# Patient Record
Sex: Female | Born: 1985 | Race: White | Hispanic: No | Marital: Married | Smoking: Former smoker
Health system: Southern US, Community
[De-identification: ages and names within clinical notes are randomized; demographics above are authoritative.]

## PROBLEM LIST (undated history)

## (undated) DIAGNOSIS — G43909 Migraine, unspecified, not intractable, without status migrainosus: Secondary | ICD-10-CM

## (undated) DIAGNOSIS — G43019 Migraine without aura, intractable, without status migrainosus: Secondary | ICD-10-CM

## (undated) DIAGNOSIS — Z5189 Encounter for other specified aftercare: Secondary | ICD-10-CM

## (undated) HISTORY — PX: ANTERIOR CRUCIATE LIGAMENT REPAIR: SHX115

## (undated) HISTORY — DX: Migraine without aura, intractable, without status migrainosus: G43.019

## (undated) HISTORY — DX: Migraine, unspecified, not intractable, without status migrainosus: G43.909

---
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
## 2016-02-15 ENCOUNTER — Ambulatory Visit (INDEPENDENT_AMBULATORY_CARE_PROVIDER_SITE_OTHER): Payer: BC Managed Care – PPO | Admitting: Neurology

## 2016-02-15 ENCOUNTER — Encounter: Payer: Self-pay | Admitting: Neurology

## 2016-02-15 DIAGNOSIS — G43019 Migraine without aura, intractable, without status migrainosus: Secondary | ICD-10-CM | POA: Diagnosis not present

## 2016-02-15 HISTORY — DX: Migraine without aura, intractable, without status migrainosus: G43.019

## 2016-02-15 MED ORDER — TOPIRAMATE 25 MG PO TABS: ORAL_TABLET | ORAL | 3 refills | Status: DC

## 2016-02-15 MED ORDER — RIZATRIPTAN BENZOATE 10 MG PO TBDP: 10.0000 mg | ORAL_TABLET | Freq: Three times a day (TID) | ORAL | 3 refills | Status: DC | PRN

## 2016-02-15 NOTE — Patient Instructions (Signed)
   Topamax (topiramate) is a seizure medication that has an FDA approval for seizures and for migraine headache. Potential side effects of this medication include weight loss, cognitive slowing, tingling in the fingers and toes, and carbonated drinks will taste bad. If any significant side effects are noted on this drug, please contact our office.  

## 2016-02-15 NOTE — Progress Notes (Signed)
Reason for visit:  Migraine headache  Referring physician:  Dr. Fredrich Birks is a 30 y.o. female  History of present illness:   Susan Grant is a 30 year old right-handed white female with a history of migraine headaches since her late teenage years. The patient had significant problems with headaches in her senior high school year, but when she got to college the headaches improved significantly. The patient indicates that there is no real family history of migraine. The headaches were relatively infrequent until about 8 months ago. At this point, she is having about 15 headache days a month. The patient has been given Fioricet to take for the headache. She is not on any daily medications. The headaches usually start in the occipital area and may project frontally associated with some blurring of vision, but with no significant dizziness or nausea or vomiting. The patient may have some photophobia, occasionally she may have a muffled sensation with the hearing. The patient may have some cognitive impairment with the headache. She indicates that the headaches may last for several hours up to 2 days at a time. She reports no numbness or weakness of the face, arms, or legs. She denies any neck pain or neck stiffness. She is sent to this office for an evaluation.  Past Medical History:  Diagnosis Date  . Common migraine with intractable migraine 02/15/2016  . Migraine     Past Surgical History:  Procedure Laterality Date  . ANTERIOR CRUCIATE LIGAMENT REPAIR Bilateral 2001, 2002  . MENISCUS REPAIR Right 2004    Family History  Problem Relation Age of Onset  . Lung cancer Maternal Grandmother   . Lung cancer Maternal Grandfather   . Breast cancer Paternal Grandmother   . Alzheimer's disease Paternal Grandfather     Social history:  reports that she quit smoking about 3 years ago. She has never used smokeless tobacco. She reports that she drinks alcohol. She reports that she  does not use drugs.  Medications:  Prior to Admission medications   Medication Sig Start Date End Date Taking? Authorizing Provider  butalbital-acetaminophen-caffeine (FIORICET, ESGIC) 50-325-40 MG tablet Take 1 tablet by mouth every 4 (four) hours as needed for headache.    Yes Historical Provider, MD     No Known Allergies  ROS:  Out of a complete 14 system review of symptoms, the patient complains only of the following symptoms, and all other reviewed systems are negative.   Ringing in the ears, dizziness  Blurring of vision  Blood pressure 118/79, pulse 79, height 5\' 5"  (1.651 m), weight 144 lb 8 oz (65.5 kg).  Physical Exam  General: The patient is alert and cooperative at the time of the examination.  Eyes: Pupils are equal, round, and reactive to light. Discs are flat bilaterally.  Neck: The neck is supple, no carotid bruits are noted.  Respiratory: The respiratory examination is clear.  Cardiovascular: The cardiovascular examination reveals a regular rate and rhythm, no obvious murmurs or rubs are noted.  Skin: Extremities are without significant edema.  Neurologic Exam  Mental status: The patient is alert and oriented x 3 at the time of the examination. The patient has apparent normal recent and remote memory, with an apparently normal attention span and concentration ability.  Cranial nerves: Facial symmetry is present. There is good sensation of the face to pinprick and soft touch bilaterally. The strength of the facial muscles and the muscles to head turning and shoulder shrug are normal bilaterally. Speech  is well enunciated, no aphasia or dysarthria is noted. Extraocular movements are full. Visual fields are full. The tongue is midline, and the patient has symmetric elevation of the soft palate. No obvious hearing deficits are noted.  Motor: The motor testing reveals 5 over 5 strength of all 4 extremities. Good symmetric motor tone is noted throughout.  Sensory:  Sensory testing is intact to pinprick, soft touch, vibration sensation, and position sense on all 4 extremities. No evidence of extinction is noted.  Coordination: Cerebellar testing reveals good finger-nose-finger and heel-to-shin bilaterally.  Gait and station: Gait is normal. Tandem gait is normal. Romberg is negative. No drift is seen.  Reflexes: Deep tendon reflexes are symmetric and normal bilaterally. Toes are downgoing bilaterally.   Assessment/Plan:   1. Migraine headache   The patient is having frequent headache at this time. We will add Topamax for her regimen, I have cautioned her about using Fioricet frequently as this may result in rebound headaches. The patient drinks the equivalent of 3 or 4 cups of coffee a day. This may need to be reduced. The patient was given Maxalt to take if needed. She can take Advil or ibuprofen for the headache as well. She will follow-up in 3 months , sooner if needed. She will call for any dose adjustments.  Jill Alexanders MD 02/15/2016 3:30 PM  [facility] [address] [place], [place] [address]  Phone [phone] Fax [phone]

## 2016-05-12 ENCOUNTER — Ambulatory Visit: Payer: BC Managed Care – PPO | Admitting: Adult Health

## 2016-05-17 ENCOUNTER — Encounter: Payer: Self-pay | Admitting: Adult Health

## 2016-05-17 ENCOUNTER — Ambulatory Visit (INDEPENDENT_AMBULATORY_CARE_PROVIDER_SITE_OTHER): Payer: BC Managed Care – PPO | Admitting: Adult Health

## 2016-05-17 VITALS — BP 113/68 | HR 84 | Ht 65.0 in | Wt 140.8 lb

## 2016-05-17 DIAGNOSIS — G43009 Migraine without aura, not intractable, without status migrainosus: Secondary | ICD-10-CM

## 2016-05-17 MED ORDER — TOPIRAMATE 25 MG PO TABS: 75.0000 mg | ORAL_TABLET | Freq: Every day | ORAL | 4 refills | Status: DC

## 2016-05-17 NOTE — Progress Notes (Signed)
I have read the note, and I agree with the clinical assessment and plan.  Susan Grant   

## 2016-05-17 NOTE — Progress Notes (Signed)
PATIENT: Susan Grant DOB: 1985-09-11  REASON FOR VISIT: follow up- migraine HISTORY FROM: patient  HISTORY OF PRESENT ILLNESS: Susan Grant is a 31 year old female with a history of migraine headaches. She returns today for follow-up. She states that since starting Topamax in December she has only had 4 migraines. She states that she had the worst migraine approximately 2 weeks ago. She states her headaches always occur in the back the head. She does have photophobia and phonophobia and nausea with severe headaches. She states that stress is normally a trigger for her. With her last headache she did try Maxalt but unfortunately was not able to take it as soon as the headache began. She states that it did not offer her any benefit. She returns today for an evaluation.  HISTORY 02/15/16:  Susan Grant is a 31 year old right-handed white female with a history of migraine headaches since her late teenage years. The patient had significant problems with headaches in her senior high school year, but when she got to college the headaches improved significantly. The patient indicates that there is no real family history of migraine. The headaches were relatively infrequent until about 8 months ago. At this point, she is having about 15 headache days a month. The patient has been given Fioricet to take for the headache. She is not on any daily medications. The headaches usually start in the occipital area and may project frontally associated with some blurring of vision, but with no significant dizziness or nausea or vomiting. The patient may have some photophobia, occasionally she may have a muffled sensation with the hearing. The patient may have some cognitive impairment with the headache. She indicates that the headaches may last for several hours up to 2 days at a time. She reports no numbness or weakness of the face, arms, or legs. She denies any neck pain or neck stiffness. She is sent to this  office for an evaluation.   REVIEW OF SYSTEMS: Out of a complete 14 system review of symptoms, the patient complains only of the following symptoms, and all other reviewed systems are negative.  Numbness  ALLERGIES: No Known Allergies  HOME MEDICATIONS: Outpatient Medications Prior to Visit  Medication Sig Dispense Refill  . butalbital-acetaminophen-caffeine (FIORICET, ESGIC) 50-325-40 MG tablet Take 1 tablet by mouth every 4 (four) hours as needed for headache.     . rizatriptan (MAXALT-MLT) 10 MG disintegrating tablet Take 1 tablet (10 mg total) by mouth 3 (three) times daily as needed for migraine. 9 tablet 3  . topiramate (TOPAMAX) 25 MG tablet Take one tablet at night for one week, then take 2 tablets at night for one week, then take 3 tablets at night. 90 tablet 3   No facility-administered medications prior to visit.     PAST MEDICAL HISTORY: Past Medical History:  Diagnosis Date  . Common migraine with intractable migraine 02/15/2016  . Migraine     PAST SURGICAL HISTORY: Past Surgical History:  Procedure Laterality Date  . ANTERIOR CRUCIATE LIGAMENT REPAIR Bilateral 2001, 2002  . MENISCUS REPAIR Right 2004    FAMILY HISTORY: Family History  Problem Relation Age of Onset  . Lung cancer Maternal Grandmother   . Lung cancer Maternal Grandfather   . Breast cancer Paternal Grandmother   . Alzheimer's disease Paternal Grandfather     SOCIAL HISTORY: Social History   Social History  . Marital status: Single    Spouse name: N/A  . Number of children: 0  . Years of  education: College   Occupational History  . [employer]    Social History Main Topics  . Smoking status: Former Smoker    Quit date: 2014  . Smokeless tobacco: Never Used  . Alcohol use Yes     Comment: 2-3 drinks per week  . Drug use: No  . Sexual activity: Yes   Other Topics Concern  . Not on file   Social History Narrative   Lives at home w/ her partner   Right-handed   Caffeine: 3  cups of coffee per day      PHYSICAL EXAM  Vitals:   05/17/16 1030  BP: 113/68  Pulse: 84  Weight: 140 lb 12.8 oz (63.9 kg)  Height: 5\' 5"  (1.651 m)   Body mass index is 23.43 kg/m.  Generalized: Well developed, in no acute distress   Neurological examination  Mentation: Alert oriented to time, place, history taking. Follows all commands speech and language fluent Cranial nerve II-XII: Pupils were equal round reactive to light. Extraocular movements were full, visual field were full on confrontational test. Facial sensation and strength were normal. Uvula tongue midline. Head turning and shoulder shrug  were normal and symmetric. Motor: The motor testing reveals 5 over 5 strength of all 4 extremities. Good symmetric motor tone is noted throughout.  Sensory: Sensory testing is intact to soft touch on all 4 extremities. No evidence of extinction is noted.  Coordination: Cerebellar testing reveals good finger-nose-finger and heel-to-shin bilaterally.  Gait and station: Gait is normal. Tandem gait is normal. Romberg is negative. No drift is seen.  Reflexes: Deep tendon reflexes are symmetric and normal bilaterally.   DIAGNOSTIC DATA (LABS, IMAGING, TESTING) - I reviewed patient records, labs, notes, testing and imaging myself where available.     ASSESSMENT AND PLAN 31 y.o. year old female  has a past medical history of Common migraine with intractable migraine (02/15/2016) and Migraine. here with:  1. Migraine headache  Overall the patient is doing well. She will continue on Topamax 75 mg at bedtime. If the numbness and tingling in the hands and feet become intolerable she should let us know. She is advised that she should take Maxalt at the onset of her migraine and repeat in 2 hours if needed. She verbalized understanding. Also discussed birth control while on Topamax. Patient is currently not taking oral contraceptives advised that she should use condoms if she is sexually  active. Patient verbalized understanding. She will follow-up in 6 months or sooner if needed.  I spent 15 minutes with the patient 50% of this time was spent counseling the patient on her medication and birth control.     Ward Givens, MSN, NP-C 05/17/2016, 10:26 AM [facility] [address] [place], [place] [address] [phone]

## 2016-05-17 NOTE — Patient Instructions (Signed)
Continue Topamax If your symptoms worsen or you develop new symptoms please let us know.   

## 2016-11-17 ENCOUNTER — Ambulatory Visit: Payer: BC Managed Care – PPO | Admitting: Adult Health

## 2017-05-08 ENCOUNTER — Other Ambulatory Visit: Payer: Self-pay

## 2017-05-08 ENCOUNTER — Ambulatory Visit: Payer: BC Managed Care – PPO | Admitting: Neurology

## 2017-05-08 ENCOUNTER — Encounter: Payer: Self-pay | Admitting: Neurology

## 2017-05-08 ENCOUNTER — Encounter (INDEPENDENT_AMBULATORY_CARE_PROVIDER_SITE_OTHER): Payer: Self-pay

## 2017-05-08 VITALS — BP 108/71 | HR 81 | Ht 65.0 in | Wt 151.5 lb

## 2017-05-08 DIAGNOSIS — G43019 Migraine without aura, intractable, without status migrainosus: Secondary | ICD-10-CM | POA: Diagnosis not present

## 2017-05-08 MED ORDER — FOLIC ACID 1 MG PO TABS: 1.0000 mg | ORAL_TABLET | Freq: Every day | ORAL | 3 refills | Status: DC

## 2017-05-08 MED ORDER — RIZATRIPTAN BENZOATE 10 MG PO TBDP: 10.0000 mg | ORAL_TABLET | Freq: Three times a day (TID) | ORAL | 3 refills | Status: DC | PRN

## 2017-05-08 NOTE — Progress Notes (Signed)
Reason for visit: Migraine headache  Susan Grant is an 32 y.o. female  History of present illness:  Susan Grant is a 32 year old right-handed white female with a history of migraine headache.  The patient has been on Topamax taking 75 mg in the evening.  The patient is tolerating the medication fairly well.  She has noted a good reduction in her headache frequency, she is having about 1 headache every other month, she takes Maxalt and Advil for the headache.  This seems to help, she is able to get rid of the headache usually within about an hour and a half.  The patient does not miss work because of the headache.  She returns for an evaluation.   Past Medical History:  Diagnosis Date  . Common migraine with intractable migraine 02/15/2016  . Migraine     Past Surgical History:  Procedure Laterality Date  . ANTERIOR CRUCIATE LIGAMENT REPAIR Bilateral 2001, 2002  . MENISCUS REPAIR Right 2004    Family History  Problem Relation Age of Onset  . Lung cancer Maternal Grandmother   . Lung cancer Maternal Grandfather   . Breast cancer Paternal Grandmother   . Alzheimer's disease Paternal Grandfather     Social history:  reports that she quit smoking about 5 years ago. she has never used smokeless tobacco. She reports that she drinks alcohol. She reports that she does not use drugs.   No Known Allergies  Medications:  Prior to Admission medications   Medication Sig Start Date End Date Taking? Authorizing Provider  rizatriptan (MAXALT-MLT) 10 MG disintegrating tablet Take 1 tablet (10 mg total) by mouth 3 (three) times daily as needed for migraine. 05/08/17  Yes Kathrynn Ducking, MD  topiramate (TOPAMAX) 25 MG tablet Take 3 tablets (75 mg total) by mouth at bedtime. 05/17/16  Yes Ward Givens, NP  folic acid (FOLVITE) 1 MG tablet Take 1 tablet (1 mg total) by mouth daily. 05/08/17   Kathrynn Ducking, MD    ROS:  Out of a complete 14 system review of symptoms, the patient  complains only of the following symptoms, and all other reviewed systems are negative.  Headache  Blood pressure 108/71, pulse 81, height 5\' 5"  (1.651 m), weight 151 lb 8 oz (68.7 kg).  Physical Exam  General: The patient is alert and cooperative at the time of the examination.  Skin: No significant peripheral edema is noted.   Neurologic Exam  Mental status: The patient is alert and oriented x 3 at the time of the examination. The patient has apparent normal recent and remote memory, with an apparently normal attention span and concentration ability.   Cranial nerves: Facial symmetry is present. Speech is normal, no aphasia or dysarthria is noted. Extraocular movements are full. Visual fields are full.  Motor: The patient has good strength in all 4 extremities.  Sensory examination: Soft touch sensation is symmetric on the face, arms, and legs.  Coordination: The patient has good finger-nose-finger and heel-to-shin bilaterally.  Gait and station: The patient has a normal gait. Tandem gait is normal. Romberg is negative. No drift is seen.  Reflexes: Deep tendon reflexes are symmetric.   Assessment/Plan:  1.  Migraine headache  The patient is now under fairly good control, she will continue the Maxalt if needed, a prescription was sent in.  The patient will continue the Topamax at the current dose.  Folic acid 1 mg tablet will be added to the regimen.  The patient will follow-up  in 1 year.  Jill Alexanders MD 05/08/2017 8:19 AM  [facility] [address] [place], [place] [address]  Phone [phone] Fax [phone]

## 2017-06-19 ENCOUNTER — Other Ambulatory Visit: Payer: Self-pay | Admitting: Adult Health

## 2018-05-06 ENCOUNTER — Other Ambulatory Visit: Payer: Self-pay | Admitting: Neurology

## 2018-05-10 ENCOUNTER — Ambulatory Visit: Payer: BC Managed Care – PPO | Admitting: Adult Health

## 2018-06-24 ENCOUNTER — Other Ambulatory Visit: Payer: Self-pay | Admitting: Neurology

## 2018-06-28 ENCOUNTER — Telehealth: Payer: Self-pay | Admitting: Neurology

## 2018-06-28 NOTE — Telephone Encounter (Signed)
Called and spoke to Patient about virtual visit she wanted to CX her apt. For now and she will call us back at a later date.

## 2018-07-04 ENCOUNTER — Ambulatory Visit: Payer: BC Managed Care – PPO | Admitting: Neurology

## 2018-07-04 ENCOUNTER — Ambulatory Visit: Payer: BC Managed Care – PPO | Admitting: Adult Health

## 2018-09-23 ENCOUNTER — Other Ambulatory Visit: Payer: Self-pay | Admitting: Neurology

## 2019-08-22 LAB — OB RESULTS CONSOLE RUBELLA ANTIBODY, IGM: Rubella: IMMUNE

## 2019-08-22 LAB — OB RESULTS CONSOLE HEPATITIS B SURFACE ANTIGEN: Hepatitis B Surface Ag: NEGATIVE

## 2019-08-22 LAB — OB RESULTS CONSOLE HIV ANTIBODY (ROUTINE TESTING): HIV: NONREACTIVE

## 2019-08-26 LAB — OB RESULTS CONSOLE GC/CHLAMYDIA
Chlamydia: NEGATIVE
Gonorrhea: NEGATIVE

## 2019-09-19 ENCOUNTER — Other Ambulatory Visit: Payer: Self-pay

## 2019-09-19 ENCOUNTER — Encounter (HOSPITAL_COMMUNITY): Payer: Self-pay | Admitting: Obstetrics and Gynecology

## 2019-09-19 ENCOUNTER — Inpatient Hospital Stay (HOSPITAL_COMMUNITY): Payer: BC Managed Care – PPO

## 2019-09-19 ENCOUNTER — Inpatient Hospital Stay (HOSPITAL_COMMUNITY)
Admission: AD | Admit: 2019-09-19 | Discharge: 2019-09-19 | Disposition: A | Discharge status: Home / Self Care | Payer: BC Managed Care – PPO | Attending: Obstetrics and Gynecology | Admitting: Obstetrics and Gynecology

## 2019-09-19 DIAGNOSIS — D259 Leiomyoma of uterus, unspecified: Secondary | ICD-10-CM | POA: Diagnosis not present

## 2019-09-19 DIAGNOSIS — Z87891 Personal history of nicotine dependence: Secondary | ICD-10-CM | POA: Diagnosis not present

## 2019-09-19 DIAGNOSIS — Z79899 Other long term (current) drug therapy: Secondary | ICD-10-CM | POA: Insufficient documentation

## 2019-09-19 DIAGNOSIS — O208 Other hemorrhage in early pregnancy: Secondary | ICD-10-CM | POA: Diagnosis not present

## 2019-09-19 DIAGNOSIS — O3412 Maternal care for benign tumor of corpus uteri, second trimester: Secondary | ICD-10-CM | POA: Diagnosis not present

## 2019-09-19 DIAGNOSIS — O209 Hemorrhage in early pregnancy, unspecified: Secondary | ICD-10-CM | POA: Diagnosis present

## 2019-09-19 DIAGNOSIS — O418X10 Other specified disorders of amniotic fluid and membranes, first trimester, not applicable or unspecified: Secondary | ICD-10-CM

## 2019-09-19 DIAGNOSIS — Z3A14 14 weeks gestation of pregnancy: Secondary | ICD-10-CM | POA: Insufficient documentation

## 2019-09-19 LAB — URINALYSIS, ROUTINE W REFLEX MICROSCOPIC
Bilirubin Urine: NEGATIVE
Glucose, UA: NEGATIVE mg/dL
Ketones, ur: 5 mg/dL — AB
Leukocytes,Ua: NEGATIVE
Nitrite: NEGATIVE
Protein, ur: NEGATIVE mg/dL
Specific Gravity, Urine: 1.006 (ref 1.005–1.030)
pH: 5 (ref 5.0–8.0)

## 2019-09-19 NOTE — MAU Provider Note (Signed)
Chief Complaint: Vaginal Bleeding   First Provider Initiated Contact with Patient 09/19/19 1717     SUBJECTIVE HPI: Susan Grant is a 34 y.o. G1P0 at [redacted]w[redacted]d who presents to Maternity Admissions reporting heavy vaginal bleeding and cramping. She denies abnormal vaginal discharge and recent intercourse (pt is in a same sex relationship). She began feeling some cramping two days ago and it has gotten progressively worse, then began bleeding heavily today. She has had two episodes of large clots followed by bright red bleeding. Bleeding slows when she rests, picks up when she goes to the bathroom. She has a confirmed IUP following IVF and was told she has a complete previa per her private MD.   Past Medical History:  Diagnosis Date  . Common migraine with intractable migraine 02/15/2016  . Migraine    OB History  Gravida Para Term Preterm AB Living  1            SAB TAB Ectopic Multiple Live Births               # Outcome Date GA Lbr Len/2nd Weight Sex Delivery Anes PTL Lv  1 Current            Past Surgical History:  Procedure Laterality Date  . ANTERIOR CRUCIATE LIGAMENT REPAIR Bilateral 2001, 2002  . MENISCUS REPAIR Right 2004   Social History   Socioeconomic History  . Marital status: Married    Spouse name: Not on file  . Number of children: 0  . Years of education: College  . Highest education level: Not on file  Occupational History  . Occupation: [employer]  Tobacco Use  . Smoking status: Former Smoker    Quit date: 2014    Years since quitting: 7.5  . Smokeless tobacco: Never Used  Vaping Use  . Vaping Use: Never used  Substance and Sexual Activity  . Alcohol use: Not Currently    Comment: 2-3 drinks per week  . Drug use: No  . Sexual activity: Not Currently  Other Topics Concern  . Not on file  Social History Narrative   Lives at home w/ her partner   Right-handed   Caffeine: 3 cups of coffee per day   Social Determinants of Health    Financial Resource Strain:   . Difficulty of Paying Living Expenses:   Food Insecurity:   . Worried About Charity fundraiser in the Last Year:   . Arboriculturist in the Last Year:   Transportation Needs:   . Film/video editor (Medical):   Marland Kitchen Lack of Transportation (Non-Medical):   Physical Activity:   . Days of Exercise per Week:   . Minutes of Exercise per Session:   Stress:   . Feeling of Stress :   Social Connections:   . Frequency of Communication with Friends and Family:   . Frequency of Social Gatherings with Friends and Family:   . Attends Religious Services:   . Active Member of Clubs or Organizations:   . Attends Archivist Meetings:   Marland Kitchen Marital Status:   Intimate Partner Violence:   . Fear of Current or Ex-Partner:   . Emotionally Abused:   Marland Kitchen Physically Abused:   . Sexually Abused:    Family History  Problem Relation Age of Onset  . Lung cancer Maternal Grandmother   . Lung cancer Maternal Grandfather   . Breast cancer Paternal Grandmother   . Alzheimer's disease Paternal Grandfather  No current facility-administered medications on file prior to encounter.   Current Outpatient Medications on File Prior to Encounter  Medication Sig Dispense Refill  . Prenatal Vit-Fe Fumarate-FA (MULTIVITAMIN-PRENATAL) 27-0.8 MG TABS tablet Take 1 tablet by mouth daily at 12 noon.    . folic acid (FOLVITE) 1 MG tablet TAKE 1 TABLET BY MOUTH EVERY DAY 90 tablet 3  . rizatriptan (MAXALT-MLT) 10 MG disintegrating tablet Take 1 tablet (10 mg total) by mouth 3 (three) times daily as needed for migraine. 9 tablet 3  . topiramate (TOPAMAX) 25 MG tablet TAKE 3 TABLETS (75 MG TOTAL) BY MOUTH AT BEDTIME. 270 tablet 0   No Known Allergies  I have reviewed patient's Past Medical Hx, Surgical Hx, Family Hx, Social Hx, medications and allergies.   Review of Systems  Constitutional: Negative for fatigue and fever.  Eyes: Negative for photophobia and visual disturbance.   Cardiovascular: Negative for palpitations.  Gastrointestinal: Positive for constipation (had bowel movement yesterday, was normal no straining). Negative for diarrhea, nausea and vomiting.  Genitourinary: Positive for frequency, pelvic pain, urgency and vaginal bleeding.  Neurological: Negative for dizziness, syncope, light-headedness and headaches.  All other systems reviewed and are negative.   OBJECTIVE Patient Vitals for the past 24 hrs:  BP Temp Temp src Pulse Resp SpO2 Height Weight  09/19/19 1637 113/78 98.6 F (37 C) -- (!) 103 18 -- 5\' 5"  (1.651 m) 157 lb (71.2 kg)  09/19/19 1611 -- -- -- (!) 104 -- -- -- --  09/19/19 1603 127/77 98.1 F (36.7 C) -- (!) 115 17 99 % -- --  09/19/19 1558 127/77 98.1 F (36.7 C) Oral (!) 115 18 100 % -- --   Constitutional: Well-developed, well-nourished female in no acute distress.  Cardiovascular: normal rate & rhythm, no murmur Respiratory: normal rate and effort. Lung sounds clear throughout GI: Abd soft, non-tender, Pos BS x 4. No guarding or rebound tenderness MS: Extremities nontender, no edema, normal ROM Neurologic: Alert and oriented x 4.  SPECULUM EXAM: NEFG, physiologic discharge, large clot removed, blood noted in vaginal vault, no CMT and cervix closed.      LAB RESULTS Results for orders placed or performed during the hospital encounter of 09/19/19 (from the past 24 hour(s))  Urinalysis, Routine w reflex microscopic     Status: Abnormal   Collection Time: 09/19/19  5:31 PM  Result Value Ref Range   Color, Urine STRAW (A) YELLOW   APPearance CLEAR CLEAR   Specific Gravity, Urine 1.006 1.005 - 1.030   pH 5.0 5.0 - 8.0   Glucose, UA NEGATIVE NEGATIVE mg/dL   Hgb urine dipstick LARGE (A) NEGATIVE   Bilirubin Urine NEGATIVE NEGATIVE   Ketones, ur 5 (A) NEGATIVE mg/dL   Protein, ur NEGATIVE NEGATIVE mg/dL   Nitrite NEGATIVE NEGATIVE   Leukocytes,Ua NEGATIVE NEGATIVE   RBC / HPF 11-20 0 - 5 RBC/hpf   WBC, UA 0-5 0 - 5  WBC/hpf   Bacteria, UA RARE (A) NONE SEEN   Squamous Epithelial / LPF 0-5 0 - 5   Mucus PRESENT     IMAGING US OB Comp Less 14 Wks  Result Date: 09/19/2019 CLINICAL DATA:  Back vaginal bleeding EXAM: OBSTETRIC <14 WK ULTRASOUND TECHNIQUE: Transabdominal ultrasound was performed for evaluation of the gestation as well as the maternal uterus and adnexal regions. COMPARISON:  None. FINDINGS: Intrauterine gestational sac: Single Yolk sac:  Not Visualized. Embryo:  Visualized. Cardiac Activity: Visualized. Heart Rate: 163 bpm CRL:   84.7 mm  14 w 2 d                  [place] EDC: 03/17/2020 Subchorionic hemorrhage: There is a moderate volume of subchorionic hemorrhage. Maternal uterus/adnexae: There is a uterine fibroid measuring approximately 5.7 cm. The ovaries are unremarkable. IMPRESSION: 1. Single live IUP at 14 weeks and 2 days. 2. Moderate volume of subchorionic hemorrhage. 3. There is a 5.7 cm uterine fibroid. Electronically Signed   By: Constance Holster M.D.   On: 09/19/2019 18:43    MAU COURSE Orders Placed This Encounter  Procedures  . Culture, OB Urine  . US OB Comp Less 14 Wks  . Urinalysis, Routine w reflex microscopic  . Discharge patient   No orders of the defined types were placed in this encounter.   MDM UA found rare bacteria, sent for culture Ultrasound showed moderate sized subchorionic hematoma  ASSESSMENT 1. Vaginal bleeding affecting early pregnancy   2. Vaginal bleeding in pregnancy, first trimester   3. Subchorionic hematoma in first trimester, single or unspecified fetus     PLAN Discharge home in stable condition. Bleeding precautions and education on [facility] given    Follow-up Information    [place]   Why: as scheduled for ongoing prenatal care Contact information: [place] [address] [phone]              Allergies as of 09/19/2019   No Known Allergies     Medication List     TAKE these medications   folic acid 1 MG tablet Commonly known as: FOLVITE TAKE 1 TABLET BY MOUTH EVERY DAY   multivitamin-prenatal 27-0.8 MG Tabs tablet Take 1 tablet by mouth daily at 12 noon.   rizatriptan 10 MG disintegrating tablet Commonly known as: MAXALT-MLT Take 1 tablet (10 mg total) by mouth 3 (three) times daily as needed for migraine.   topiramate 25 MG tablet Commonly known as: TOPAMAX TAKE 3 TABLETS (75 MG TOTAL) BY MOUTH AT BEDTIME.        Gabriel Carina, [place] 09/19/2019  8:15 PM

## 2019-09-19 NOTE — Discharge Instructions (Signed)
Second Trimester of Pregnancy  The second trimester is from week 14 through week 27 (month 4 through 6). This is often the time in pregnancy that you feel your best. Often times, morning sickness has lessened or quit. You may have more energy, and you may get hungry more often. Your unborn baby is growing rapidly. At the end of the sixth month, he or she is about 9 inches long and weighs about 1 pounds. You will likely feel the baby move between 18 and 20 weeks of pregnancy. Follow these instructions at home: Medicines  Take over-the-counter and prescription medicines only as told by your doctor. Some medicines are safe and some medicines are not safe during pregnancy.  Take a prenatal vitamin that contains at least 600 micrograms (mcg) of folic acid.  If you have trouble pooping (constipation), take medicine that will make your stool soft (stool softener) if your doctor approves. Eating and drinking   Eat regular, healthy meals.  Avoid raw meat and uncooked cheese.  If you get low calcium from the food you eat, talk to your doctor about taking a daily calcium supplement.  Avoid foods that are high in fat and sugars, such as fried and sweet foods.  If you feel sick to your stomach (nauseous) or throw up (vomit): ? Eat 4 or 5 small meals a day instead of 3 large meals. ? Try eating a few soda crackers. ? Drink liquids between meals instead of during meals.  To prevent constipation: ? Eat foods that are high in fiber, like fresh fruits and vegetables, whole grains, and beans. ? Drink enough fluids to keep your pee (urine) clear or pale yellow. Activity  Exercise only as told by your doctor. Stop exercising if you start to have cramps.  Do not exercise if it is too hot, too humid, or if you are in a place of great height (high altitude).  Avoid heavy lifting.  Wear low-heeled shoes. Sit and stand up straight.  You can continue to have sex unless your doctor tells you not  to. Relieving pain and discomfort  Wear a good support bra if your breasts are tender.  Take warm water baths (sitz baths) to soothe pain or discomfort caused by hemorrhoids. Use hemorrhoid cream if your doctor approves.  Rest with your legs raised if you have leg cramps or low back pain.  If you develop puffy, bulging veins (varicose veins) in your legs: ? Wear support hose or compression stockings as told by your doctor. ? Raise (elevate) your feet for 15 minutes, 3-4 times a day. ? Limit salt in your food. Prenatal care  Write down your questions. Take them to your prenatal visits.  Keep all your prenatal visits as told by your doctor. This is important. Safety  Wear your seat belt when driving.  Make a list of emergency phone numbers, including numbers for family, friends, the hospital, and police and fire departments. General instructions  Ask your doctor about the right foods to eat or for help finding a counselor, if you need these services.  Ask your doctor about local prenatal classes. Begin classes before month 6 of your pregnancy.  Do not use hot tubs, steam rooms, or saunas.  Do not douche or use tampons or scented sanitary pads.  Do not cross your legs for long periods of time.  Visit your dentist if you have not done so. Use a soft toothbrush to brush your teeth. Floss gently.  Avoid all smoking, herbs,   and alcohol. Avoid drugs that are not approved by your doctor.  Do not use any products that contain nicotine or tobacco, such as cigarettes and e-cigarettes. If you need help quitting, ask your doctor.  Avoid cat litter boxes and soil used by cats. These carry germs that can cause birth defects in the baby and can cause a loss of your baby (miscarriage) or stillbirth. Contact a doctor if:  You have mild cramps or pressure in your lower belly.  You have pain when you pee (urinate).  You have bad smelling fluid coming from your vagina.  You continue to  feel sick to your stomach (nauseous), throw up (vomit), or have watery poop (diarrhea).  You have a nagging pain in your belly area.  You feel dizzy. Get help right away if:  You have a fever.  You are leaking fluid from your vagina.  You have spotting or bleeding from your vagina.  You have severe belly cramping or pain.  You lose or gain weight rapidly.  You have trouble catching your breath and have chest pain.  You notice sudden or extreme puffiness (swelling) of your face, hands, ankles, feet, or legs.  You have not felt the baby move in over an hour.  You have severe headaches that do not go away when you take medicine.  You have trouble seeing. Summary  The second trimester is from week 14 through week 27 (months 4 through 6). This is often the time in pregnancy that you feel your best.  To take care of yourself and your unborn baby, you will need to eat healthy meals, take medicines only if your doctor tells you to do so, and do activities that are safe for you and your baby.  Call your doctor if you get sick or if you notice anything unusual about your pregnancy. Also, call your doctor if you need help with the right food to eat, or if you want to know what activities are safe for you. This information is not intended to replace advice given to you by your health care provider. Make sure you discuss any questions you have with your health care provider. Document Revised: 06/15/2018 Document Reviewed: 03/29/2016 Elsevier Patient Education  2020 [place] Hematoma  A subchorionic hematoma is a gathering of blood between the outer wall of the embryo (chorion) and the inner wall of the womb (uterus). This condition can cause vaginal bleeding. If they cause little or no vaginal bleeding, early small hematomas usually shrink on their own and do not affect your baby or pregnancy. When bleeding starts later in pregnancy, or if the hematoma is larger or occurs  in older pregnant women, the condition may be more serious. Larger hematomas may get bigger, which increases the chances of miscarriage. This condition also increases the risk of:  Premature separation of the placenta from the uterus.  Premature (preterm) labor.  Stillbirth. What are the causes? The exact cause of this condition is not known. It occurs when blood is trapped between the placenta and the uterine wall because the placenta has separated from the original site of implantation. What increases the risk? You are more likely to develop this condition if:  You were treated with fertility medicines.  You conceived through in vitro fertilization (IVF). What are the signs or symptoms? Symptoms of this condition include:  Vaginal spotting or bleeding.  Contractions of the uterus. These cause abdominal pain. Sometimes you may have no symptoms and the bleeding may  only be seen when ultrasound images are taken (transvaginal ultrasound). How is this diagnosed? This condition is diagnosed based on a physical exam. This includes a pelvic exam. You may also have other tests, including:  Blood tests.  Urine tests.  Ultrasound of the abdomen. How is this treated? Treatment for this condition can vary. Treatment may include:  Watchful waiting. You will be monitored closely for any changes in bleeding. During this stage: ? The hematoma may be reabsorbed by the body. ? The hematoma may separate the fluid-filled space containing the embryo (gestational sac) from the wall of the womb (endometrium).  Medicines.  Activity restriction. This may be needed until the bleeding stops. Follow these instructions at home:  Stay on bed rest if told to do so by your health care provider.  Do not lift anything that is heavier than 10 lbs. (4.5 kg) or as told by your health care provider.  Do not use any products that contain nicotine or tobacco, such as cigarettes and e-cigarettes. If you need  help quitting, ask your health care provider.  Track and write down the number of pads you use each day and how soaked (saturated) they are.  Do not use tampons.  Keep all follow-up visits as told by your health care provider. This is important. Your health care provider may ask you to have follow-up blood tests or ultrasound tests or both. Contact a health care provider if:  You have any vaginal bleeding.  You have a fever. Get help right away if:  You have severe cramps in your stomach, back, abdomen, or pelvis.  You pass large clots or tissue. Save any tissue for your health care provider to look at.  You have more vaginal bleeding, and you faint or become lightheaded or weak. Summary  A subchorionic hematoma is a gathering of blood between the outer wall of the placenta and the uterus.  This condition can cause vaginal bleeding.  Sometimes you may have no symptoms and the bleeding may only be seen when ultrasound images are taken.  Treatment may include watchful waiting, medicines, or activity restriction. This information is not intended to replace advice given to you by your health care provider. Make sure you discuss any questions you have with your health care provider. Document Revised: 02/03/2017 Document Reviewed: 04/19/2016 Elsevier Patient Education  2020 [employer].

## 2019-09-19 NOTE — ED Provider Notes (Signed)
MSE was initiated and I personally evaluated the patient and placed orders (if any) at  4:13 PM on September 19, 2019.  Briefly patient is a 34 year old female G1, P0 A0 who presents to the emergency department with complaints of pelvic cramping over the past 2 days with vaginal bleeding that started just prior to arrival.  She is approximately [redacted] weeks pregnant, established with an OB, had an ultrasound which confirmed an IUP.  On exam she is nontoxic, resting comfortably, her vitals are within normal limits with the exception of mild tachycardia, improved some to 104 on my exam.  Her abdomen is soft without peritoneal signs.  16:13: Discussed with Elmyra Ricks APP at [facility]- accepts patient in transfer.     Leafy Kindle 09/19/19 1624    Breck Coons, MD 09/19/19 [phone]

## 2019-09-19 NOTE — MAU Note (Signed)
Pt stated she went to the [facility] today and felt a clot pass.  Had a lot of blood in toliet. And when wiping. Called OB and told to come in. Pt also stated she was told she had a complete placenta previa. Reports some mild cramping.

## 2019-09-21 LAB — CULTURE, OB URINE: Culture: NO GROWTH

## 2020-02-05 ENCOUNTER — Ambulatory Visit: Payer: BC Managed Care – PPO | Admitting: Internal Medicine

## 2020-02-05 ENCOUNTER — Other Ambulatory Visit: Payer: Self-pay

## 2020-02-05 ENCOUNTER — Encounter: Payer: Self-pay | Admitting: Internal Medicine

## 2020-02-05 ENCOUNTER — Encounter: Payer: Self-pay | Admitting: *Deleted

## 2020-02-05 VITALS — BP 122/68 | HR 108 | Ht 65.0 in | Wt 194.0 lb

## 2020-02-05 DIAGNOSIS — R011 Cardiac murmur, unspecified: Secondary | ICD-10-CM | POA: Diagnosis not present

## 2020-02-05 DIAGNOSIS — R002 Palpitations: Secondary | ICD-10-CM | POA: Diagnosis not present

## 2020-02-05 NOTE — Patient Instructions (Signed)
Medication Instructions:  Your physician recommends that you continue on your current medications as directed. Please refer to the Current Medication list given to you today.  *If you need a refill on your cardiac medications before your next appointment, please call your pharmacy*   Lab Work: None   If you have labs (blood work) drawn today and your tests are completely normal, you will receive your results only by: Marland Kitchen MyChart Message (if you have MyChart) OR . A paper copy in the mail If you have any lab test that is abnormal or we need to change your treatment, we will call you to review the results.   Testing/Procedures: Your physician has requested that you have an echocardiogram. Echocardiography is a painless test that uses sound waves to create images of your heart. It provides your doctor with information about the size and shape of your heart and how well your heart's chambers and valves are working. This procedure takes approximately one hour. There are no restrictions for this procedure.  Your physician has recommended that you wear a 7 day monitor. These monitors are medical devices that record the heart's electrical activity. Doctors most often use these monitors to diagnose arrhythmias. Arrhythmias are problems with the speed or rhythm of the heartbeat. The monitor is a small, portable device. You can wear one while you do your normal daily activities. This is usually used to diagnose what is causing palpitations/syncope (passing out).  Follow-Up: At [facility], you and your health needs are our priority.  As part of our continuing mission to provide you with exceptional heart care, we have created designated Provider Care Teams.  These Care Teams include your primary Cardiologist (physician) and Advanced Practice Providers (APPs -  Physician Assistants and Nurse Practitioners) who all work together to provide you with the care you need, when you need it.  We recommend  signing up for the patient portal called "MyChart".  Sign up information is provided on this After Visit Summary.  MyChart is used to connect with patients for Virtual Visits (Telemedicine).  Patients are able to view lab/test results, encounter notes, upcoming appointments, etc.  Non-urgent messages can be sent to your provider as well.   To learn more about what you can do with MyChart, go to [email or web address].    Your next appointment:   3 month(s)  The format for your next appointment:   In Person  Provider:   Rudean Haskell, MD   Other Instructions [place] Monitor Instructions   Your physician has requested you wear your ZIO patch monitor 7days.   This is a single patch monitor.  [employer] supplies one patch monitor per enrollment.  Additional stickers are not available.   Please do not apply patch if you will be having a Nuclear Stress Test, Echocardiogram, Cardiac CT, MRI, or Chest Xray during the time frame you would be wearing the monitor. The patch cannot be worn during these tests.  You cannot remove and re-apply the ZIO XT patch monitor.   Your ZIO patch monitor will be sent USPS Priority mail from [facility] directly to your home address. The monitor may also be mailed to a PO BOX if home delivery is not available.   It may take 3-5 days to receive your monitor after you have been enrolled.   Once you have received you monitor, please review enclosed instructions.  Your monitor has already been registered assigning a specific monitor serial # to you.   Applying  the monitor   Shave hair from upper left chest.   Hold abrader disc by orange tab.  Rub abrader in 40 strokes over left upper chest as indicated in your monitor instructions.   Clean area with 4 enclosed alcohol pads .  Use all pads to assure are is cleaned thoroughly.  Let dry.   Apply patch as indicated in monitor instructions.  Patch will be place under collarbone on left side of  chest with arrow pointing upward.   Rub patch adhesive wings for 2 minutes.Remove white label marked "1".  Remove white label marked "2".  Rub patch adhesive wings for 2 additional minutes.   While looking in a mirror, press and release button in center of patch.  A small green light will flash 3-4 times .  This will be your only indicator the monitor has been turned on.     Do not shower for the first 24 hours.  You may shower after the first 24 hours.   Press button if you feel a symptom. You will hear a small click.  Record Date, Time and Symptom in the Patient Log Book.   When you are ready to remove patch, follow instructions on last 2 pages of Patient Log Book.  Stick patch monitor onto last page of Patient Log Book.   Place Patient Log Book in [place] box.  Use locking tab on box and tape box closed securely.  The [employer] and [employer] has [employer] on it.  Please place in mailbox as soon as possible.  Your physician should have your test results approximately 7 days after the monitor has been mailed back to [facility].   Call [place] at [phone] if you have questions regarding your ZIO XT patch monitor.  Call them immediately if you see an orange light blinking on your monitor.   If your monitor falls off in less than 4 days contact our Monitor department at [phone].  If your monitor becomes loose or falls off after 4 days call [employer] at [phone] for suggestions on securing your monitor.

## 2020-02-05 NOTE — Progress Notes (Addendum)
Cardiology Office Note:    Date:  02/05/2020   ID:  Susan Grant, DOB 1985-06-14, MRN [id number]  PCP:  Susan Shorten, MD  [place] Cardiologist:  No primary care provider on file.  [facility] Electrophysiologist:  None   CC: Palpitations Consulted for the evaluation of tachycardia at the behest of Susan Shorten, MD  History of Present Illness:    Susan Grant is a 34 y.o. female with a hx of pregnancy (G1P0), history of heart murmur, heavy vaginal bleeding who presents with tachycardia  Patient notes that (s)he is feeling well. Notes palpitations when just sitting on the couch, and notes in her third trimester that she has had an elevated resting heart rate.  Never had this issues prior with resting heart rate in the 70s or 80s. Prior to pregnancy, No fevers, chills, night sweats.Had no further bleeding issues and her doctors told her that her blood count was normal.  Has had no chest pain, chest pressure, chest tightness, chest stinging.  No sudden onset calf pain or tenderness.  Patient exerts without issues and does 30 minutes of walking even in the third trimester; she feels no symptoms.  No shortness of breath, DOE unless going up hills.  No PND or orthopnea.  No bendopnea.  No syncope or near syncope.  Patient reports no prior cardiac testing including echo, stress test, heart catheterizations, cardioversion, ablations.  Past Medical History:  Diagnosis Date  . Common migraine with intractable migraine 02/15/2016  . Migraine     Past Surgical History:  Procedure Laterality Date  . ANTERIOR CRUCIATE LIGAMENT REPAIR Bilateral 2001, 2002  . MENISCUS REPAIR Right 2004    Current Medications: No outpatient medications have been marked as taking for the 02/05/20 encounter (Office Visit) with Werner Lean, MD.     Allergies:   Patient has no known allergies.   Social History   Socioeconomic History  . Marital status: Married     Spouse name: Not on file  . Number of children: 0  . Years of education: College  . Highest education level: Not on file  Occupational History  . Occupation: [employer]  Tobacco Use  . Smoking status: Former Smoker    Quit date: 2014    Years since quitting: 7.9  . Smokeless tobacco: Never Used  Vaping Use  . Vaping Use: Never used  Substance and Sexual Activity  . Alcohol use: Not Currently    Comment: 2-3 drinks per week  . Drug use: No  . Sexual activity: Not Currently  Other Topics Concern  . Not on file  Social History Narrative   Lives at home w/ her partner   Right-handed   Caffeine: 3 cups of coffee per day   Social Determinants of Health   Financial Resource Strain:   . Difficulty of Paying Living Expenses: Not on file  Food Insecurity:   . Worried About Charity fundraiser in the Last Year: Not on file  . Ran Out of Food in the Last Year: Not on file  Transportation Needs:   . Lack of Transportation (Medical): Not on file  . Lack of Transportation (Non-Medical): Not on file  Physical Activity:   . Days of Exercise per Week: Not on file  . Minutes of Exercise per Session: Not on file  Stress:   . Feeling of Stress : Not on file  Social Connections:   . Frequency of Communication with Friends and Family: Not on file  .  Frequency of Social Gatherings with Friends and Family: Not on file  . Attends Religious Services: Not on file  . Active Member of Clubs or Organizations: Not on file  . Attends Archivist Meetings: Not on file  . Marital Status: Not on file    Family History: The patient's family history includes Alzheimer's disease in her paternal grandfather; Breast cancer in her paternal grandmother; Lung cancer in her maternal grandfather and maternal grandmother. Denies family history of sudden cardiac death including drowning, car accidents, or unexplained deaths in the family.  No aortic dissection. No history of non-ischemic  cardiomyopathies including hypertrophic cardiomyopathy, left ventricular non-compaction, or arrhythmogenic right ventricular cardiomyopathy.  ROS:   Please see the history of present illness.    All other systems reviewed and are negative.  EKGs/Labs/Other Studies Reviewed:    The following studies were reviewed today:  EKG:  EKG is ordered today.  The ekg ordered today demonstrates Sinus Tachycardia rate 107  Recent Labs: No results found for requested labs within last 8760 hours.  Recent Lipid Panel No results found for: CHOL, TRIG, HDL, CHOLHDL, VLDL, LDLCALC, LDLDIRECT   Risk Assessment/Calculations:    CARPREG II Risk of 1, but only in the setting of Third Trimester assessment  Physical Exam:    VS:  BP 122/68   Pulse (!) 108   Ht 5\' 5"  (1.651 m)   Wt 194 lb (88 kg)   SpO2 97%   BMI 32.28 kg/m     Wt Readings from Last 3 Encounters:  02/05/20 194 lb (88 kg)  09/19/19 157 lb (71.2 kg)  05/08/17 151 lb 8 oz (68.7 kg)     GEN: Well nourished, well developed in no acute distress HEENT: Normal NECK: No JVD; No carotid bruits LYMPHATICS: No lymphadenopathy CARDIAC: regular, elevated rate, II/VI systolic crescendo murmur no change with valsalva, no rubs, gallops RESPIRATORY:  Clear to auscultation without rales, wheezing or rhonchi  ABDOMEN: Soft, non-tender, non-distended MUSCULOSKELETAL:  No edema; No deformity  SKIN: Warm and dry NEUROLOGIC:  Alert and oriented x 3 PSYCHIATRIC:  Normal affect   ASSESSMENT:    1. Palpitations   2. Heart murmur    PLAN:    In order of problems listed above:  Sinus Tachycardia in Pregnancy Palpitations Heart Murmmur - CARPREG  II Score of 1 - will get echocardiogram to evaluate cardiac causes of reactive tachycardia - will get 7 day non-live ziopatch for evaluation of arrhythmia  Will see 6-8 weeks post pregnancy for evaluation of cardiac complications in the 4th trimester; unless new symptoms or abnormal test results  warranting change in plan   Shared Decision Making/Informed Consent      Patient amenable to plan  Medication Adjustments/Labs and Tests Ordered: Current medicines are reviewed at length with the patient today.  Concerns regarding medicines are outlined above.  Orders Placed This Encounter  Procedures  . LONG TERM MONITOR (3-14 DAYS)  . EKG 12-Lead  . ECHOCARDIOGRAM COMPLETE   No orders of the defined types were placed in this encounter.   Patient Instructions  Medication Instructions:  Your physician recommends that you continue on your current medications as directed. Please refer to the Current Medication list given to you today.  *If you need a refill on your cardiac medications before your next appointment, please call your pharmacy*   Lab Work: None   If you have labs (blood work) drawn today and your tests are completely normal, you will receive your results only  by: . MyChart Message (if you have MyChart) OR . A paper copy in the mail If you have any lab test that is abnormal or we need to change your treatment, we will call you to review the results.   Testing/Procedures: Your physician has requested that you have an echocardiogram. Echocardiography is a painless test that uses sound waves to create images of your heart. It provides your doctor with information about the size and shape of your heart and how well your heart's chambers and valves are working. This procedure takes approximately one hour. There are no restrictions for this procedure.  Your physician has recommended that you wear a 7 day monitor. These monitors are medical devices that record the heart's electrical activity. Doctors most often use these monitors to diagnose arrhythmias. Arrhythmias are problems with the speed or rhythm of the heartbeat. The monitor is a small, portable device. You can wear one while you do your normal daily activities. This is usually used to diagnose what is causing  palpitations/syncope (passing out).  Follow-Up: At [facility], you and your health needs are our priority.  As part of our continuing mission to provide you with exceptional heart care, we have created designated Provider Care Teams.  These Care Teams include your primary Cardiologist (physician) and Advanced Practice Providers (APPs -  Physician Assistants and Nurse Practitioners) who all work together to provide you with the care you need, when you need it.  We recommend signing up for the patient portal called "MyChart".  Sign up information is provided on this After Visit Summary.  MyChart is used to connect with patients for Virtual Visits (Telemedicine).  Patients are able to view lab/test results, encounter notes, upcoming appointments, etc.  Non-urgent messages can be sent to your provider as well.   To learn more about what you can do with MyChart, go to [email or web address].    Your next appointment:   3 month(s)  The format for your next appointment:   In Person  Provider:   Rudean Haskell, MD   Other Instructions [place] Monitor Instructions   Your physician has requested you wear your ZIO patch monitor 7days.   This is a single patch monitor.  [employer] supplies one patch monitor per enrollment.  Additional stickers are not available.   Please do not apply patch if you will be having a Nuclear Stress Test, Echocardiogram, Cardiac CT, MRI, or Chest Xray during the time frame you would be wearing the monitor. The patch cannot be worn during these tests.  You cannot remove and re-apply the ZIO XT patch monitor.   Your ZIO patch monitor will be sent USPS Priority mail from [facility] directly to your home address. The monitor may also be mailed to a PO BOX if home delivery is not available.   It may take 3-5 days to receive your monitor after you have been enrolled.   Once you have received you monitor, please review enclosed instructions.  Your  monitor has already been registered assigning a specific monitor serial # to you.   Applying the monitor   Shave hair from upper left chest.   Hold abrader disc by orange tab.  Rub abrader in 40 strokes over left upper chest as indicated in your monitor instructions.   Clean area with 4 enclosed alcohol pads .  Use all pads to assure are is cleaned thoroughly.  Let dry.   Apply patch as indicated in monitor instructions.  Patch will be  place under collarbone on left side of chest with arrow pointing upward.   Rub patch adhesive wings for 2 minutes.Remove white label marked "1".  Remove white label marked "2".  Rub patch adhesive wings for 2 additional minutes.   While looking in a mirror, press and release button in center of patch.  A small green light will flash 3-4 times .  This will be your only indicator the monitor has been turned on.     Do not shower for the first 24 hours.  You may shower after the first 24 hours.   Press button if you feel a symptom. You will hear a small click.  Record Date, Time and Symptom in the Patient Log Book.   When you are ready to remove patch, follow instructions on last 2 pages of Patient Log Book.  Stick patch monitor onto last page of Patient Log Book.   Place Patient Log Book in [place] box.  Use locking tab on box and tape box closed securely.  The [employer] has [employer] on it.  Please place in mailbox as soon as possible.  Your physician should have your test results approximately 7 days after the monitor has been mailed back to [facility].   Call [place] at [phone] if you have questions regarding your ZIO XT patch monitor.  Call them immediately if you see an orange light blinking on your monitor.   If your monitor falls off in less than 4 days contact our Monitor department at [phone].  If your monitor becomes loose or falls off after 4 days call [employer] at [phone] for suggestions on  securing your monitor.       Signed, Werner Lean, MD  02/05/2020 9:02 AM    [facility]   Received HCT from OSH- 34 and stable  Werner Lean, MD

## 2020-02-05 NOTE — Progress Notes (Signed)
Patient ID: Susan Grant, female   DOB: [birth year], 34 y.o.   MRN: [id number] Patient enrolled for [employer] to ship a 7 day ZIO XT long term holter monitor to her home.

## 2020-02-13 ENCOUNTER — Telehealth: Payer: Self-pay | Admitting: Internal Medicine

## 2020-02-13 ENCOUNTER — Ambulatory Visit (HOSPITAL_COMMUNITY): Payer: BC Managed Care – PPO | Attending: Cardiovascular Disease

## 2020-02-13 ENCOUNTER — Other Ambulatory Visit: Payer: Self-pay

## 2020-02-13 ENCOUNTER — Other Ambulatory Visit (INDEPENDENT_AMBULATORY_CARE_PROVIDER_SITE_OTHER): Payer: BC Managed Care – PPO

## 2020-02-13 DIAGNOSIS — R002 Palpitations: Secondary | ICD-10-CM

## 2020-02-13 DIAGNOSIS — R011 Cardiac murmur, unspecified: Secondary | ICD-10-CM | POA: Diagnosis not present

## 2020-02-13 LAB — ECHOCARDIOGRAM COMPLETE
Area-P 1/2: 6.71 cm2
S' Lateral: 2.9 cm

## 2020-02-13 NOTE — Telephone Encounter (Signed)
Called Patient discuss  Of echocardiogram results.    Mild Mitral Regurgitation:  Cannot rule out Parachute Mitral Valve.  There is no evidence of a Shone Complex:  No MS/supravalvular MS; Evidence of subaortic membranes or Spectral Doppler signal of Coarctaion. Typically Parachute MV is not as isolated finding and this is reassuring  Patient notes that her heart rate is still a bit fast, lower during echo (confirmed).  No change in CARPREG II Score or necessary changes in her management.  Will follow ZioPatch (started today).  Presently, no change in delivery management would be suggested; will eventually need reimaging.  Post pregnancy will discuss TEE for eval.  Patient had no further questions.  Werner Lean, MD

## 2020-02-27 LAB — OB RESULTS CONSOLE GBS: GBS: NEGATIVE

## 2020-03-07 NOTE — L&D Delivery Note (Addendum)
Delivery Note At 1:17 PM a viable female was delivered via Vaginal, Spontaneous (Presentation: Right Occiput Anterior).  APGAR: 9, 9; weight  .   Placenta status: Manual removal, Intact.  Cord: 3 vessels with the following complications: postpartum hemorrhage.  Hymnenal tear was present during pushing with slow bleeding. Continued bleeding following delivery and made hemostatic with suture.  Placenta given 30 minutes for spontaneous expulsion with gentile traction but unsuccessful.  Manual extraction completed followed by manual exploration, bimanual pressure applied for atony and bleeding with improvement in tone.  Patient noted to be nauseous, lightheaded, and hypotensive.  IVF bolus begun and PPH was resolved with improvement in symptoms as well as BP.  TXA given.  Laceration repair completed and hemostasis noted.  Anesthesia: Epidural Episiotomy: None Lacerations: 3rd degree (3a);Labial;Perineal Suture Repair: 2.0 3.0 vicryl Est. Blood Loss (mL): 1876 QBL.  Significant amniotic fluid and urine make quantification of blood difficult. Suspect EBL at least 1500 cc.  Given PPH and signs of anemia, will transfuse 1 u pRBC.  Mom to postpartum.  Baby to Couplet care / Skin to Skin.  Carlyon Shadow 03/18/2020, 2:41 PM  Addendum made 03/23/20 in response to [facility] query for specification of laceration degree.

## 2020-03-10 ENCOUNTER — Encounter (HOSPITAL_COMMUNITY): Payer: Self-pay | Admitting: *Deleted

## 2020-03-10 ENCOUNTER — Telehealth (HOSPITAL_COMMUNITY): Payer: Self-pay | Admitting: *Deleted

## 2020-03-10 NOTE — Telephone Encounter (Signed)
Preadmission screen  

## 2020-03-16 ENCOUNTER — Other Ambulatory Visit (HOSPITAL_COMMUNITY)
Admission: RE | Admit: 2020-03-16 | Discharge: 2020-03-16 | Disposition: A | Discharge status: Home / Self Care | Payer: BC Managed Care – PPO | Source: Ambulatory Visit | Attending: Obstetrics and Gynecology | Admitting: Obstetrics and Gynecology

## 2020-03-16 DIAGNOSIS — U071 COVID-19: Secondary | ICD-10-CM | POA: Insufficient documentation

## 2020-03-16 DIAGNOSIS — Z01812 Encounter for preprocedural laboratory examination: Secondary | ICD-10-CM | POA: Insufficient documentation

## 2020-03-16 LAB — SARS CORONAVIRUS 2 (TAT 6-24 HRS): SARS Coronavirus 2: POSITIVE — AB

## 2020-03-17 ENCOUNTER — Other Ambulatory Visit: Payer: Self-pay

## 2020-03-17 ENCOUNTER — Encounter (HOSPITAL_COMMUNITY): Payer: Self-pay | Admitting: Obstetrics and Gynecology

## 2020-03-17 ENCOUNTER — Inpatient Hospital Stay (HOSPITAL_COMMUNITY)
Admission: AD | Admit: 2020-03-17 | Discharge: 2020-03-20 | DRG: 768 | Disposition: A | Discharge status: Home / Self Care | Payer: BC Managed Care – PPO | Attending: Obstetrics and Gynecology | Admitting: Obstetrics and Gynecology

## 2020-03-17 ENCOUNTER — Inpatient Hospital Stay (HOSPITAL_COMMUNITY): Payer: BC Managed Care – PPO

## 2020-03-17 DIAGNOSIS — Z3A39 39 weeks gestation of pregnancy: Secondary | ICD-10-CM

## 2020-03-17 DIAGNOSIS — D62 Acute posthemorrhagic anemia: Secondary | ICD-10-CM | POA: Diagnosis not present

## 2020-03-17 DIAGNOSIS — O133 Gestational [pregnancy-induced] hypertension without significant proteinuria, third trimester: Secondary | ICD-10-CM | POA: Diagnosis present

## 2020-03-17 DIAGNOSIS — O9081 Anemia of the puerperium: Secondary | ICD-10-CM | POA: Diagnosis not present

## 2020-03-17 DIAGNOSIS — Z87891 Personal history of nicotine dependence: Secondary | ICD-10-CM

## 2020-03-17 DIAGNOSIS — O9852 Other viral diseases complicating childbirth: Secondary | ICD-10-CM | POA: Diagnosis present

## 2020-03-17 DIAGNOSIS — U071 COVID-19: Secondary | ICD-10-CM | POA: Diagnosis present

## 2020-03-17 DIAGNOSIS — O134 Gestational [pregnancy-induced] hypertension without significant proteinuria, complicating childbirth: Principal | ICD-10-CM | POA: Diagnosis present

## 2020-03-17 DIAGNOSIS — R030 Elevated blood-pressure reading, without diagnosis of hypertension: Secondary | ICD-10-CM | POA: Diagnosis present

## 2020-03-17 LAB — COMPREHENSIVE METABOLIC PANEL
ALT: 20 U/L (ref 0–44)
AST: 22 U/L (ref 15–41)
Albumin: 2.7 g/dL — ABNORMAL LOW (ref 3.5–5.0)
Alkaline Phosphatase: 74 U/L (ref 38–126)
Anion gap: 13 (ref 5–15)
BUN: 7 mg/dL (ref 6–20)
CO2: 18 mmol/L — ABNORMAL LOW (ref 22–32)
Calcium: 9.1 mg/dL (ref 8.9–10.3)
Chloride: 103 mmol/L (ref 98–111)
Creatinine, Ser: 0.58 mg/dL (ref 0.44–1.00)
GFR, Estimated: >60 mL/min (ref >60)
Glucose, Bld: 92 mg/dL (ref 70–99)
Potassium: 3.1 mmol/L — ABNORMAL LOW (ref 3.5–5.1)
Sodium: 134 mmol/L — ABNORMAL LOW (ref 135–145)
Total Bilirubin: 0.5 mg/dL (ref 0.3–1.2)
Total Protein: 6 g/dL — ABNORMAL LOW (ref 6.5–8.1)

## 2020-03-17 LAB — CBC
HCT: 33 % — ABNORMAL LOW (ref 36.0–46.0)
Hemoglobin: 11.3 g/dL — ABNORMAL LOW (ref 12.0–15.0)
MCH: 31.1 pg (ref 26.0–34.0)
MCHC: 34.2 g/dL (ref 30.0–36.0)
MCV: 90.9 fL (ref 80.0–100.0)
Platelets: 175 10*3/uL (ref 150–400)
RBC: 3.63 MIL/uL — ABNORMAL LOW (ref 3.87–5.11)
RDW: 12.8 % (ref 11.5–15.5)
WBC: 8.1 10*3/uL (ref 4.0–10.5)
nRBC: 0 % (ref 0.0–0.2)

## 2020-03-17 MED ORDER — DIPHENHYDRAMINE HCL 50 MG/ML IJ SOLN: 12.5000 mg | INTRAMUSCULAR | Status: DC | PRN

## 2020-03-17 MED ORDER — ONDANSETRON HCL 4 MG/2ML IJ SOLN
4.0000 mg | Freq: Four times a day (QID) | INTRAMUSCULAR | Status: DC | PRN
  Administered 2020-03-18: 4 mg via INTRAVENOUS
  Filled 2020-03-17: qty 2

## 2020-03-17 MED ORDER — LIDOCAINE HCL (PF) 1 % IJ SOLN
30.0000 mL | INTRAMUSCULAR | Status: DC | PRN
  Filled 2020-03-17: qty 30

## 2020-03-17 MED ORDER — SOD CITRATE-CITRIC ACID 500-334 MG/5ML PO SOLN: 30.0000 mL | ORAL | Status: DC | PRN

## 2020-03-17 MED ORDER — LACTATED RINGERS IV SOLN: 500.0000 mL | INTRAVENOUS | Status: DC | PRN

## 2020-03-17 MED ORDER — PHENYLEPHRINE 40 MCG/ML (10ML) SYRINGE FOR IV PUSH (FOR BLOOD PRESSURE SUPPORT)
80.0000 ug | PREFILLED_SYRINGE | INTRAVENOUS | Status: AC | PRN
  Administered 2020-03-18 (×3): 80 ug via INTRAVENOUS
  Filled 2020-03-17 (×2): qty 10

## 2020-03-17 MED ORDER — OXYTOCIN-SODIUM CHLORIDE 30-0.9 UT/500ML-% IV SOLN
2.5000 [IU]/h | INTRAVENOUS | Status: DC
  Administered 2020-03-18: 2.5 [IU]/h via INTRAVENOUS
  Filled 2020-03-17: qty 500

## 2020-03-17 MED ORDER — EPHEDRINE 5 MG/ML INJ: 10.0000 mg | INTRAVENOUS | Status: DC | PRN

## 2020-03-17 MED ORDER — FENTANYL-BUPIVACAINE-NACL 0.5-0.125-0.9 MG/250ML-% EP SOLN
12.0000 mL/h | EPIDURAL | Status: DC | PRN
  Filled 2020-03-17 (×2): qty 250

## 2020-03-17 MED ORDER — OXYCODONE-ACETAMINOPHEN 5-325 MG PO TABS: 2.0000 | ORAL_TABLET | ORAL | Status: DC | PRN

## 2020-03-17 MED ORDER — OXYTOCIN BOLUS FROM INFUSION: 333.0000 mL | Freq: Once | INTRAVENOUS | Status: DC

## 2020-03-17 MED ORDER — TERBUTALINE SULFATE 1 MG/ML IJ SOLN: 0.2500 mg | Freq: Once | INTRAMUSCULAR | Status: DC | PRN

## 2020-03-17 MED ORDER — ACETAMINOPHEN 325 MG PO TABS: 650.0000 mg | ORAL_TABLET | ORAL | Status: DC | PRN

## 2020-03-17 MED ORDER — LACTATED RINGERS IV SOLN: INTRAVENOUS | Status: DC

## 2020-03-17 MED ORDER — PHENYLEPHRINE 40 MCG/ML (10ML) SYRINGE FOR IV PUSH (FOR BLOOD PRESSURE SUPPORT)
80.0000 ug | PREFILLED_SYRINGE | INTRAVENOUS | Status: AC | PRN
  Administered 2020-03-18 (×3): 80 ug via INTRAVENOUS

## 2020-03-17 MED ORDER — BUTORPHANOL TARTRATE 1 MG/ML IJ SOLN: 1.0000 mg | INTRAMUSCULAR | Status: DC | PRN

## 2020-03-17 MED ORDER — OXYTOCIN-SODIUM CHLORIDE 30-0.9 UT/500ML-% IV SOLN
1.0000 m[IU]/min | INTRAVENOUS | Status: DC
  Administered 2020-03-17: 2 m[IU]/min via INTRAVENOUS
  Filled 2020-03-17: qty 500

## 2020-03-17 MED ORDER — OXYCODONE-ACETAMINOPHEN 5-325 MG PO TABS: 1.0000 | ORAL_TABLET | ORAL | Status: DC | PRN

## 2020-03-17 MED ORDER — LACTATED RINGERS IV SOLN
500.0000 mL | Freq: Once | INTRAVENOUS | Status: AC
  Administered 2020-03-18: 500 mL via INTRAVENOUS

## 2020-03-17 NOTE — Progress Notes (Signed)
Inboxed Dr. Corinna Capra about covid test results.

## 2020-03-17 NOTE — H&P (Signed)
Susan Grant is a 35 y.o. female presenting for IOL for elective reasons but did have some mildly elevated BPs.  No severe features.  GBS-.  IVF pregnancy.  Asymptomatic routine screening for covid today was positive. . OB History    Gravida  1   Para      Term      Preterm      AB      Living        SAB      IAB      Ectopic      Multiple      Live Births             Past Medical History:  Diagnosis Date  . Common migraine with intractable migraine 02/15/2016  . Migraine    Past Surgical History:  Procedure Laterality Date  . ANTERIOR CRUCIATE LIGAMENT REPAIR Bilateral 2001, 2002  . MENISCUS REPAIR Right 2004   Family History: family history includes Alzheimer's disease in her paternal grandfather; Breast cancer in her paternal grandmother; Lung cancer in her maternal grandfather and maternal grandmother. Social History:  reports that she quit smoking about 8 years ago. She has never used smokeless tobacco. She reports previous alcohol use. She reports that she does not use drugs.     Maternal Diabetes: No Genetic Screening: Normal Maternal Ultrasounds/Referrals: Normal Fetal Ultrasounds or other Referrals:  None Maternal Substance Abuse:  No Significant Maternal Medications:  None Significant Maternal Lab Results:  Group B Strep negative Other Comments:  None  Review of Systems History Dilation: 2.5 Effacement (%): 70 Station: -3 Exam by:: Janann August, RN Blood pressure 131/79, pulse 92, temperature 98.5 F (36.9 C), temperature source Oral, resp. rate 20. Exam Physical Exam  Prenatal labs: ABO, Rh: --/--/O POS (01/11 1522) Antibody: NEG (01/11 1522) Rubella:   RPR:    HBsAg:    HIV:    GBS:     Assessment/Plan: IUP at 39 weeks IOL - IV pitocin and AROM.  Anticipate SVD Covid Pos - thinks she may have had covid 2 weeks ago but never tested.  Both she and her partner have are aysmptomatic. Will use covid protocol   Luz Lex 03/17/2020, 7:02 PM

## 2020-03-17 NOTE — Progress Notes (Signed)
Patient ID: Susan Grant, female   DOB: 10-14-1985, 35 y.o.   MRN: [id number] Mild ctxs tolerated well VSSAF FHR 140s with accels Ctxs q 2-4 minutes.  Cx 3-4/80/ballotable (US shows Vtx)  Continue increasing Pitocin.   Unable to AROM at this time. DL

## 2020-03-18 ENCOUNTER — Inpatient Hospital Stay (HOSPITAL_COMMUNITY): Payer: BC Managed Care – PPO | Admitting: Anesthesiology

## 2020-03-18 ENCOUNTER — Encounter (HOSPITAL_COMMUNITY): Payer: Self-pay | Admitting: Obstetrics and Gynecology

## 2020-03-18 LAB — POSTPARTUM HEMORRHAGE PROTOCOL (BB NOTIFICATION)

## 2020-03-18 LAB — DIC (DISSEMINATED INTRAVASCULAR COAGULATION)PANEL
D-Dimer, Quant: 8.09 ug/mL-FEU — ABNORMAL HIGH (ref 0.00–0.50)
Fibrinogen: 336 mg/dL (ref 210–475)
INR: 1.2 (ref 0.8–1.2)
Platelets: 148 10*3/uL — ABNORMAL LOW (ref 150–400)
Prothrombin Time: 14.9 seconds (ref 11.4–15.2)
Smear Review: NONE SEEN
aPTT: 27 seconds (ref 24–36)

## 2020-03-18 LAB — PREPARE RBC (CROSSMATCH)

## 2020-03-18 LAB — ABO/RH: ABO/RH(D): O POS

## 2020-03-18 LAB — RPR: RPR Ser Ql: NONREACTIVE

## 2020-03-18 MED ORDER — COCONUT OIL OIL
1.0000 "application " | TOPICAL_OIL | Status: DC | PRN
  Administered 2020-03-19: 1 via TOPICAL

## 2020-03-18 MED ORDER — SENNOSIDES-DOCUSATE SODIUM 8.6-50 MG PO TABS
2.0000 | ORAL_TABLET | ORAL | Status: DC
  Administered 2020-03-19: 2 via ORAL
  Filled 2020-03-18: qty 2

## 2020-03-18 MED ORDER — LACTATED RINGERS IV SOLN: INTRAVENOUS | Status: DC

## 2020-03-18 MED ORDER — ONDANSETRON HCL 4 MG/2ML IJ SOLN: 4.0000 mg | INTRAMUSCULAR | Status: DC | PRN

## 2020-03-18 MED ORDER — DIPHENHYDRAMINE HCL 25 MG PO CAPS: 25.0000 mg | ORAL_CAPSULE | Freq: Four times a day (QID) | ORAL | Status: DC | PRN

## 2020-03-18 MED ORDER — SIMETHICONE 80 MG PO CHEW: 80.0000 mg | CHEWABLE_TABLET | ORAL | Status: DC | PRN

## 2020-03-18 MED ORDER — ONDANSETRON HCL 4 MG PO TABS: 4.0000 mg | ORAL_TABLET | ORAL | Status: DC | PRN

## 2020-03-18 MED ORDER — ZOLPIDEM TARTRATE 5 MG PO TABS: 5.0000 mg | ORAL_TABLET | Freq: Every evening | ORAL | Status: DC | PRN

## 2020-03-18 MED ORDER — PRENATAL MULTIVITAMIN CH
1.0000 | ORAL_TABLET | Freq: Every day | ORAL | Status: DC
  Administered 2020-03-19: 1 via ORAL
  Filled 2020-03-18 (×2): qty 1

## 2020-03-18 MED ORDER — BENZOCAINE-MENTHOL 20-0.5 % EX AERO: 1.0000 "application " | INHALATION_SPRAY | CUTANEOUS | Status: DC | PRN

## 2020-03-18 MED ORDER — SODIUM CHLORIDE 0.9% IV SOLUTION: Freq: Once | INTRAVENOUS | Status: DC

## 2020-03-18 MED ORDER — SODIUM CHLORIDE (PF) 0.9 % IJ SOLN
INTRAMUSCULAR | Status: DC | PRN
  Administered 2020-03-18: 12 mL/h via EPIDURAL

## 2020-03-18 MED ORDER — TRANEXAMIC ACID-NACL 1000-0.7 MG/100ML-% IV SOLN
1000.0000 mg | INTRAVENOUS | Status: AC
  Administered 2020-03-18: 1000 mg via INTRAVENOUS

## 2020-03-18 MED ORDER — ACETAMINOPHEN 325 MG PO TABS
650.0000 mg | ORAL_TABLET | ORAL | Status: DC | PRN
  Administered 2020-03-18: 650 mg via ORAL
  Filled 2020-03-18: qty 2

## 2020-03-18 MED ORDER — LIDOCAINE HCL (PF) 1 % IJ SOLN
INTRAMUSCULAR | Status: DC | PRN
  Administered 2020-03-18 (×2): 4 mL via EPIDURAL

## 2020-03-18 MED ORDER — WITCH HAZEL-GLYCERIN EX PADS: 1.0000 "application " | MEDICATED_PAD | CUTANEOUS | Status: DC | PRN

## 2020-03-18 MED ORDER — IBUPROFEN 600 MG PO TABS
600.0000 mg | ORAL_TABLET | Freq: Four times a day (QID) | ORAL | Status: DC
  Administered 2020-03-18 – 2020-03-20 (×7): 600 mg via ORAL
  Filled 2020-03-18 (×8): qty 1

## 2020-03-18 MED ORDER — TRANEXAMIC ACID-NACL 1000-0.7 MG/100ML-% IV SOLN
INTRAVENOUS | Status: AC
  Filled 2020-03-18: qty 100

## 2020-03-18 MED ORDER — DIBUCAINE (PERIANAL) 1 % EX OINT: 1.0000 "application " | TOPICAL_OINTMENT | CUTANEOUS | Status: DC | PRN

## 2020-03-18 MED ORDER — LACTATED RINGERS IV BOLUS: 1000.0000 mL | Freq: Once | INTRAVENOUS | Status: DC

## 2020-03-18 MED ORDER — TETANUS-DIPHTH-ACELL PERTUSSIS 5-2.5-18.5 LF-MCG/0.5 IM SUSY: 0.5000 mL | PREFILLED_SYRINGE | Freq: Once | INTRAMUSCULAR | Status: DC

## 2020-03-18 NOTE — Anesthesia Procedure Notes (Signed)
Epidural Patient location during procedure: OB Start time: 03/18/2020 5:08 AM End time: 03/18/2020 5:11 AM  Staffing Anesthesiologist: Brennan Bailey, MD Performed: anesthesiologist   Preanesthetic Checklist Completed: patient identified, IV checked, risks and benefits discussed, monitors and equipment checked, pre-op evaluation and timeout performed  Epidural Patient position: sitting Prep: DuraPrep and site prepped and draped Patient monitoring: continuous pulse ox, blood pressure and heart rate Approach: midline Location: L3-L4 Injection technique: LOR air  Needle:  Needle type: Tuohy  Needle gauge: 17 G Needle length: 9 cm Needle insertion depth: 5 cm Catheter type: closed end flexible Catheter size: 19 Gauge Catheter at skin depth: 10 cm Test dose: negative and Other (1% lidocaine)  Assessment Events: blood not aspirated, injection not painful, no injection resistance, no paresthesia and negative IV test  Additional Notes Patient identified. Risks, benefits, and alternatives discussed with patient including but not limited to bleeding, infection, nerve damage, paralysis, failed block, incomplete pain control, headache, blood pressure changes, nausea, vomiting, reactions to medication, itching, and postpartum back pain. Confirmed with bedside nurse the patient's most recent platelet count. Confirmed with patient that they are not currently taking any anticoagulation, have any bleeding history, or any family history of bleeding disorders. Patient expressed understanding and wished to proceed. All questions were answered. Sterile technique was used throughout the entire procedure. Please see nursing notes for vital signs.   Crisp LOR on first pass. Test dose was given through epidural catheter and negative prior to continuing to dose epidural or start infusion. Warning signs of high block given to the patient including shortness of breath, tingling/numbness in hands, complete  motor block, or any concerning symptoms with instructions to call for help. Patient was given instructions on fall risk and not to get out of bed. All questions and concerns addressed with instructions to call with any issues or inadequate analgesia.  Reason for block:procedure for pain

## 2020-03-18 NOTE — Progress Notes (Signed)
After delivery  (1330)and prior to the delivery of the placenta, the patient complained of blurry vision and feeling like she was going to faint.Blood pressures cycled frequently. Pt hypotensive and symptomatic. RROb called in addition to charge RN at bedside. Hemorrhage cart in room. CRNA called to bedside. Pt treated with phenylepherine and fluid boluses. Symptoms resolved when systolic pressures were in the 90/s. Dr Mardelle Matte was at bedside

## 2020-03-18 NOTE — Progress Notes (Signed)
During patient's assessment at [facility], RN found patient to have a distended bladder with Fundus deviated to the Right.  Bleeding was scant.  Susan Grant was used to attempt to get patient up to the bathroom; however she fainted on the stedy.  RN called for help; two RN's were able to safely get patient back into the bed.  Patient felt better after she was back in bed.  Patient was able to void on a bedpan; bleeding still minimal and vss.  MD updated and night shift RN informed.

## 2020-03-18 NOTE — Progress Notes (Signed)
Labor Progress Note  Patient progressed to complete.  Now pushing. FHT with Moderate variability, early decels present.    Continue current management and anticipate vaginal delivery.

## 2020-03-18 NOTE — Progress Notes (Signed)
At bedside to check on patient, s/p PPH.  On postpartum floor, patient has done well.  Lightheadedness has been much improved since delivery.  Mild hypotension improved, mild tachycardia intermittently present.  She reports minimal vaginal bleeding currently. Pain is well controlled.  When she was up to use the bathroom, she had a syncopal episode.  She was helped back to bed recovered.  Then used the bedpan and was able to void spontaneously.  Blood is currently infusing and CBC was not collected as part of [place] panel... instead will check in AM for post transfusion CBC because blood actively infusing and bleeding is now minimal.  Coags WNL and fibrinogen 336, PLT 148  Gen: alert, sitting up in bed, no distress Chest:  nonlabored breathing Abdomen: fundus firm Ext: no signs of DVT  Vitals with BMI 03/18/2020 03/18/2020 03/18/2020  Height - - -  Weight - - -  BMI - - -  Systolic 96 628 315  Diastolic 57 69 67  Pulse 176 122 104   A/P: s/p PPH, status improved. Continue to monitor symptoms and vitals.  1u pRBC infusion, coags WNL, check CBC in AM  Alpha Gula MD

## 2020-03-18 NOTE — Lactation Note (Signed)
This note was copied from a baby's chart. Lactation Consultation Note  Patient Name: Susan Grant [id number] Date: 03/18/2020 Reason for consult: Initial assessment;Mother's request;Difficult latch;Primapara;1st time breastfeeding;Term (PPH and Blood transfusion) Age:35 hours  Mom able to latch infant in L and D and once on the floor. Mom getting a blood transfusion following PPH. As a result, LC discussed supplementation options and we reviewed the [facility] consent form and placed it in infant's chart.   LC went over breast massage and hand expression. Mom set up DEBP with parts, assembly, cleaning and storage reviewed.  Mom pumped for about 10 minutes and 2 ml EBM collected. 1 ml finger fed to infant. Infant latched on the right breast with signs of milk transfer in football.   Mom's breast are red but no signs of abrasion. She had a compression stripe on the right breast which resolved with better positioning and flanging out the lips. Mom stated pinching at the breast resolved.   Plan 1. To feed based on cues 8-12 x in 24 hour period, no more than 4 hours without an attempt.         2. Mom to offer both breasts, STS and look for signs of milk transfer. After nursing, offer DBM with paced bottle feeding 7-12 ml as tolerated with yellow slow flow nipple. RN, Alphia Moh to go over [place] feedings with Mom.        3. If infant unable to latch, Mom taught how to hand express and spoon feed before latching.       4. DEBP q 3 hours for 15 minutes.        5. I and O sheet reviewed.         6. [place] brochure of inpatient and outpatient services reviewed.

## 2020-03-18 NOTE — Anesthesia Preprocedure Evaluation (Signed)
Anesthesia Evaluation  Patient identified by MRN, date of birth, ID band Patient awake    Reviewed: Allergy & Precautions, Patient's Chart, lab work & pertinent test results  History of Anesthesia Complications Negative for: history of anesthetic complications  Airway Mallampati: II  TM Distance: >3 FB Neck ROM: Full    Dental no notable dental hx.    Pulmonary former smoker,  COVID+   Pulmonary exam normal        Cardiovascular negative cardio ROS Normal cardiovascular exam     Neuro/Psych  Headaches, negative psych ROS   GI/Hepatic negative GI ROS, Neg liver ROS,   Endo/Other  negative endocrine ROS  Renal/GU negative Renal ROS  negative genitourinary   Musculoskeletal negative musculoskeletal ROS (+)   Abdominal   Peds  Hematology negative hematology ROS (+)   Anesthesia Other Findings Day of surgery medications reviewed with patient.  Reproductive/Obstetrics (+) Pregnancy                             Anesthesia Physical Anesthesia Plan  ASA: II  Anesthesia Plan: Epidural   Post-op Pain Management:    Induction:   PONV Risk Score and Plan: Treatment may vary due to age or medical condition  Airway Management Planned: Natural Airway  Additional Equipment:   Intra-op Plan:   Post-operative Plan:   Informed Consent: I have reviewed the patients History and Physical, chart, labs and discussed the procedure including the risks, benefits and alternatives for the proposed anesthesia with the patient or authorized representative who has indicated his/her understanding and acceptance.       Plan Discussed with:   Anesthesia Plan Comments:         Anesthesia Quick Evaluation

## 2020-03-19 LAB — CBC
HCT: 20.2 % — ABNORMAL LOW (ref 36.0–46.0)
Hemoglobin: 6.6 g/dL — CL (ref 12.0–15.0)
MCH: 28.8 pg (ref 26.0–34.0)
MCHC: 32.7 g/dL (ref 30.0–36.0)
MCV: 88.2 fL (ref 80.0–100.0)
Platelets: 138 10*3/uL — ABNORMAL LOW (ref 150–400)
RBC: 2.29 MIL/uL — ABNORMAL LOW (ref 3.87–5.11)
RDW: 15.2 % (ref 11.5–15.5)
WBC: 14.9 10*3/uL — ABNORMAL HIGH (ref 4.0–10.5)
nRBC: 0 % (ref 0.0–0.2)

## 2020-03-19 LAB — PREPARE RBC (CROSSMATCH)

## 2020-03-19 MED ORDER — SODIUM CHLORIDE 0.9% IV SOLUTION: Freq: Once | INTRAVENOUS | Status: DC

## 2020-03-19 MED ORDER — FERROUS SULFATE 325 (65 FE) MG PO TABS
325.0000 mg | ORAL_TABLET | ORAL | Status: DC
Start: 2020-03-19 — End: 2020-03-20
  Administered 2020-03-19: 325 mg via ORAL
  Filled 2020-03-19 (×2): qty 1

## 2020-03-19 NOTE — Progress Notes (Signed)
Results for Susan Grant, Susan Grant (MRN [id number]) as of 03/19/2020 06:57  Ref. Range 03/19/2020 06:26  Hemoglobin Latest Ref Range: 12.0 - 15.0 g/dL 6.6 (LL)    Critical Hgb lab of 6.6. RN [employer] notified.

## 2020-03-19 NOTE — Progress Notes (Signed)
Patient had order for continuous pulse oximetry reading. Patient had been getting intermittent readings throughout day with vital signs. Called Dr. Lynnette Caffey and per verbal orders, the continuous pulse ox order could be discontinued.

## 2020-03-19 NOTE — Anesthesia Postprocedure Evaluation (Signed)
Anesthesia Post Note  Patient: Adelma H Phillips-Mashburn  Procedure(s) Performed: AN AD HOC LABOR EPIDURAL     Patient location during evaluation: Mother Baby Anesthesia Type: Epidural Level of consciousness: awake and alert and oriented Pain management: satisfactory to patient Vital Signs Assessment: post-procedure vital signs reviewed and stable Respiratory status: respiratory function stable Cardiovascular status: stable Postop Assessment: no headache, no backache, epidural receding, patient able to bend at knees, no signs of nausea or vomiting, adequate PO intake and able to ambulate Anesthetic complications: no   No complications documented.  Last Vitals:  Vitals:   03/18/20 2145 03/18/20 2220  BP: 102/67 (!) 110/56  Pulse: (!) 103 100  Resp: 18 18  Temp: 36.9 C 36.9 C  SpO2: 100% 100%    Last Pain:  Vitals:   03/18/20 2220  TempSrc: Oral  PainSc: 0-No pain   Pain Goal:                   Shweta Aman

## 2020-03-19 NOTE — Progress Notes (Signed)
Post Partum Day 1 Subjective: no complaints, up ad lib, voiding and tolerating PO.  Syncopal episode x 2 yesterday.  Able to ambulate without dizziness since that time.  No CP/SOB.  No HA, vision change, RUQ pain.  Objective: Blood pressure (!) 110/56, pulse 100, temperature 98.5 F (36.9 C), temperature source Oral, resp. rate 18, height 5\' 5"  (1.651 m), weight 90.7 kg, SpO2 100 %, unknown if currently breastfeeding.  Physical Exam:  General: alert, cooperative and appears stated age Lochia: appropriate Uterine Fundus: firm Incision: healing well, no significant drainage, no dehiscence DVT Evaluation: No evidence of DVT seen on physical exam. Negative Homan's sign. No cords or calf tenderness.  Recent Labs    03/17/20 1502 03/19/20 0626  HGB 11.3* 6.6*  HCT 33.0* 20.2*    Assessment/Plan: Plan for discharge tomorrow  Acute blood loss anemia from [place] (EBL 1876cc).  Received 1u PRBCs immediately postpartum.  Counseled for 2nd unit today and patient accepts.  Will order as well as FeSO4.   GHTN-normal BPs but anemic-will monitor as volume is repleted.   COVID pos-asymptomatic; no circ inpatient   LOS: 2 days   Linda Hedges 03/19/2020, 9:49 AM

## 2020-03-19 NOTE — Progress Notes (Signed)
Pt up and ambulated to bathroom.  Slow and steady gait.  Denies lightheadedness and dizziness.  Voided quantity sufficient.  Peri care given with instruction.  Back to bed without incident

## 2020-03-20 ENCOUNTER — Ambulatory Visit: Payer: Self-pay

## 2020-03-20 LAB — TYPE AND SCREEN
ABO/RH(D): O POS
Antibody Screen: NEGATIVE
Unit division: 0
Unit division: 0
Unit division: 0

## 2020-03-20 LAB — BPAM RBC
Blood Product Expiration Date: 202201242359
Blood Product Expiration Date: 202202122359
Blood Product Expiration Date: 202202142359
ISSUE DATE / TIME: 202201122009
ISSUE DATE / TIME: 202201131402
ISSUE DATE / TIME: 202201131600
Unit Type and Rh: 5100
Unit Type and Rh: 5100
Unit Type and Rh: 9500

## 2020-03-20 LAB — CBC
HCT: 20.7 % — ABNORMAL LOW (ref 36.0–46.0)
Hemoglobin: 7.2 g/dL — ABNORMAL LOW (ref 12.0–15.0)
MCH: 30.9 pg (ref 26.0–34.0)
MCHC: 34.8 g/dL (ref 30.0–36.0)
MCV: 88.8 fL (ref 80.0–100.0)
Platelets: 141 10*3/uL — ABNORMAL LOW (ref 150–400)
RBC: 2.33 MIL/uL — ABNORMAL LOW (ref 3.87–5.11)
RDW: 15.1 % (ref 11.5–15.5)
WBC: 12.9 10*3/uL — ABNORMAL HIGH (ref 4.0–10.5)
nRBC: 0 % (ref 0.0–0.2)

## 2020-03-20 NOTE — Lactation Note (Signed)
This note was copied from a baby's chart. Lactation Consultation Note  Patient Name: Susan Grant TDVVO'[id number] Date: 03/20/2020   Parents report being d/c today.  Answered all questions concerns. Urged to call lactation as needed.           Jada Kuhnert S Elienai Gailey 03/20/2020, 3:27 PM

## 2020-03-20 NOTE — Discharge Summary (Signed)
Postpartum Discharge Summary  Date of Service March 20, 2020     Patient Name: Susan Grant DOB: 1985/12/17 MRN: [id number]  Date of admission: 03/17/2020 Delivery date:03/18/2020  Delivering provider: Eyvonne Mechanic A  Date of discharge: 03/20/2020  Admitting diagnosis: Gestational hypertension, third trimester [O13.3] Intrauterine pregnancy: [redacted]w[redacted]d     Secondary diagnosis:  Active Problems:   Gestational hypertension, third trimester  Additional problems: pph and COVID +    Discharge diagnosis: Gestational Hypertension                                              Post partum procedures:blood transfusion Augmentation: AROM, Pitocin and Cytotec Complications: [id number]  Hospital course: Induction of Labor With Vaginal Delivery   35 y.o. yo G1P1001 at [redacted]w[redacted]d was admitted to the hospital 03/17/2020 for induction of labor.  Indication for induction: Gestational hypertension.  Patient had an uncomplicated labor course as follows: Membrane Rupture Time/Date: 4:40 AM ,03/18/2020   Delivery Method:Vaginal, Spontaneous  Episiotomy: None  Lacerations:  3rd degree;Labial;Perineal  Details of delivery can be found in separate delivery note.  Patient had a routine postpartum course. Patient is discharged home 03/20/20.  Newborn Data: Birth date:03/18/2020  Birth time:1:17 PM  Gender:Female  Living status:Living  Apgars:9 ,9  Weight:3856 g   Magnesium Sulfate received: No BMZ received: No Rhophylac:No MMR:No T-DaP:Given prenatally Flu: No Transfusion:Yes  Physical exam  Vitals:   03/19/20 1637 03/19/20 1900 03/19/20 2016 03/19/20 2024  BP: 109/64 127/67 112/73   Pulse: (!) 102 91 (!) 112 (!) 115  Resp: $Remo'20 18 18   'vtTHC$ Temp: 98.8 F (37.1 C) 98.5 F (36.9 C) 98.6 F (37 C)   TempSrc: Oral Oral Oral   SpO2: 96% 100% 99%   Weight:      Height:       General: alert, cooperative and no distress Lochia: appropriate Uterine Fundus: firm Incision:  Healing well with no significant drainage DVT Evaluation: No evidence of DVT seen on physical exam. Labs: Lab Results  Component Value Date   WBC 12.9 (H) 03/20/2020   HGB 7.2 (L) 03/20/2020   HCT 20.7 (L) 03/20/2020   MCV 88.8 03/20/2020   PLT 141 (L) 03/20/2020   CMP Latest Ref Rng & Units 03/17/2020  Glucose 70 - 99 mg/dL 92  BUN 6 - 20 mg/dL 7  Creatinine 0.44 - 1.00 mg/dL 0.58  Sodium 135 - 145 mmol/L 134(L)  Potassium 3.5 - 5.1 mmol/L 3.1(L)  Chloride 98 - 111 mmol/L 103  CO2 22 - 32 mmol/L 18(L)  Calcium 8.9 - 10.3 mg/dL 9.1  Total Protein 6.5 - 8.1 g/dL 6.0(L)  Total Bilirubin 0.3 - 1.2 mg/dL 0.5  Alkaline Phos 38 - 126 U/L 74  AST 15 - 41 U/L 22  ALT 0 - 44 U/L 20   Edinburgh Score: Edinburgh Postnatal Depression Scale Screening Tool 03/19/2020  I have been able to laugh and see the funny side of things. 0  I have looked forward with enjoyment to things. 0  I have blamed myself unnecessarily when things went wrong. 1  I have been anxious or worried for no good reason. 0  I have felt scared or panicky for no good reason. 0  Things have been getting on top of me. 0  I have been so unhappy that I have had difficulty sleeping. 0  I have felt sad or miserable. 0  I have been so unhappy that I have been crying. 0  The thought of harming myself has occurred to me. 0  Edinburgh Postnatal Depression Scale Total 1      After visit meds:  Allergies as of 03/20/2020   No Known Allergies     Medication List    TAKE these medications   multivitamin-prenatal 27-0.8 MG Tabs tablet Take 1 tablet by mouth daily at 12 noon.        Discharge home in stable condition Infant Feeding: Breast Infant Disposition:home with mother Discharge instruction: per After Visit Summary and Postpartum booklet. Activity: Advance as tolerated. Pelvic rest for 6 weeks.  Diet: routine diet Anticipated Birth Control: Unsure Postpartum Appointment:6 weeks Additional Postpartum F/U: not  applicable Future Appointments: Future Appointments  Date Time Provider [place]  05/05/2020  9:00 AM Werner Lean, MD [facility]   Follow up Visit:      03/20/2020 Cyril Mourning, MD

## 2020-05-05 ENCOUNTER — Ambulatory Visit: Payer: BC Managed Care – PPO | Admitting: Internal Medicine

## 2021-03-30 IMAGING — US US OB COMP LESS 14 WK
1 series · 15 of 28 positions shown · non-contrast
Comparison: None.

CLINICAL DATA: Back vaginal bleeding

EXAM:
OBSTETRIC <14 WK ULTRASOUND
TECHNIQUE: Transabdominal ultrasound was performed for evaluation of the
gestation as well as the maternal uterus and adnexal regions.

[Series 1: us ob comp less 14 wk · 28 acquisitions, 15 frames shown]
[im 1/28]
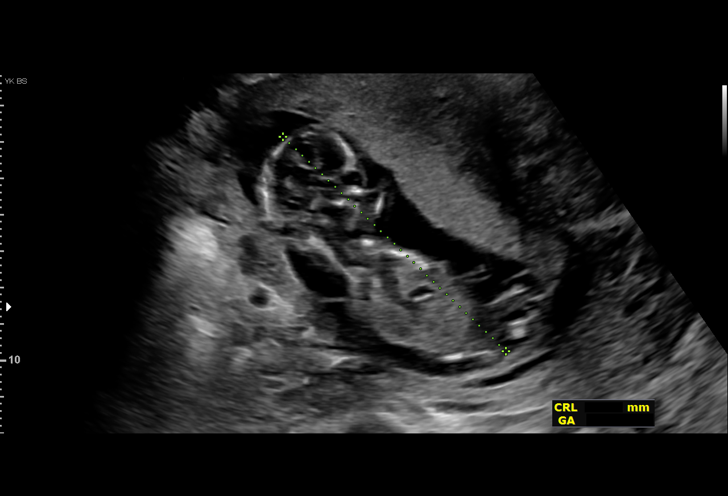
[im 3/28]
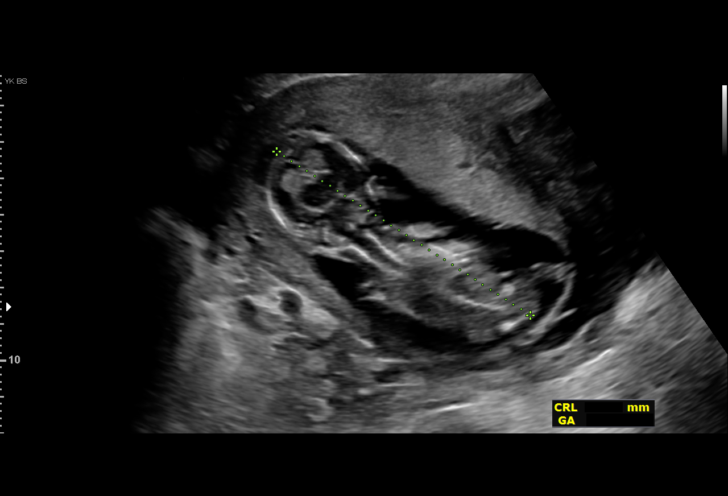
[im 5/28]
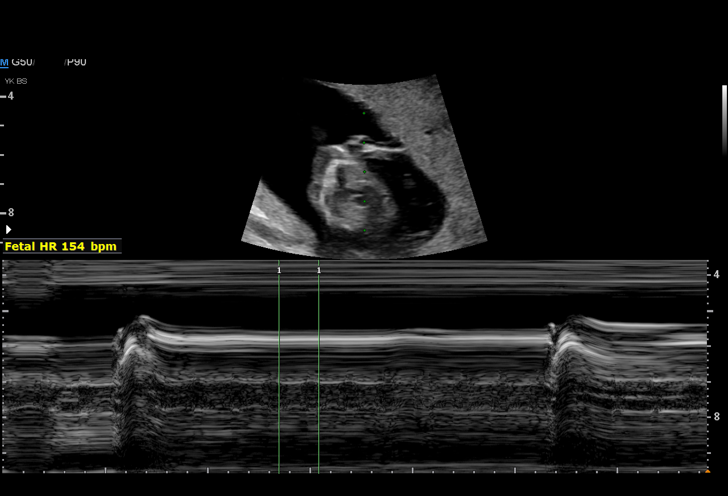
[im 7/28]
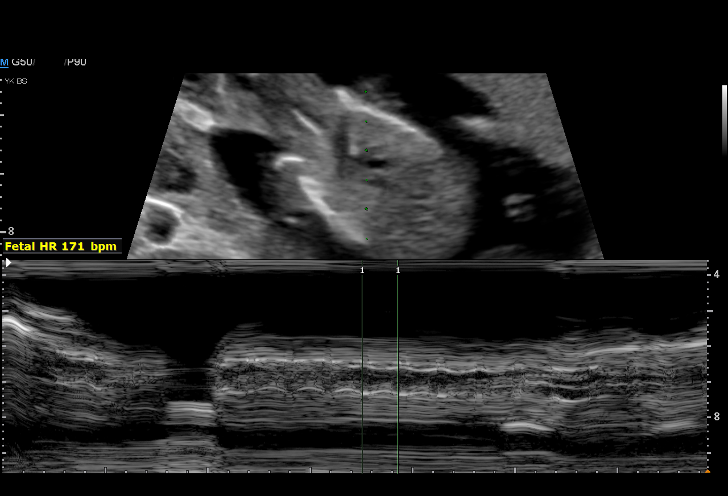
[im 9/28]
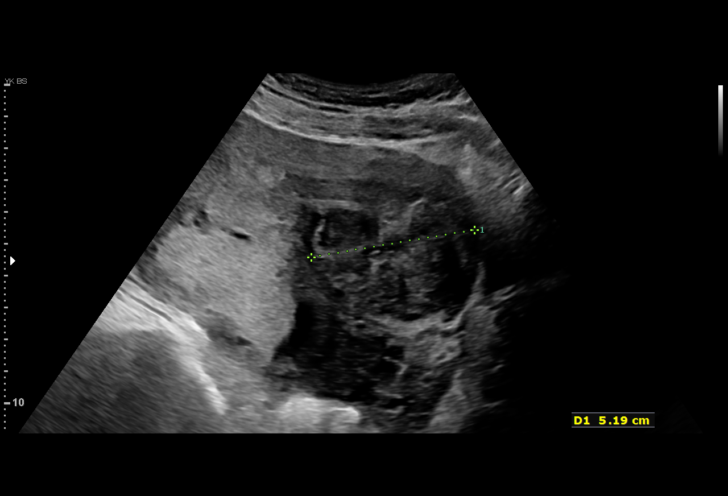
[im 11/28]
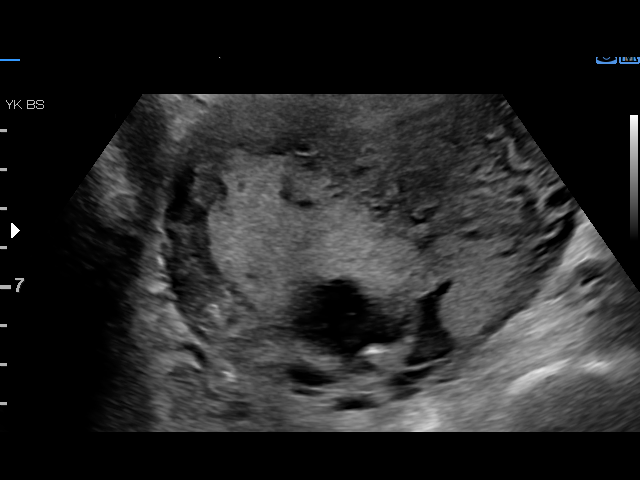
[im 13/28]
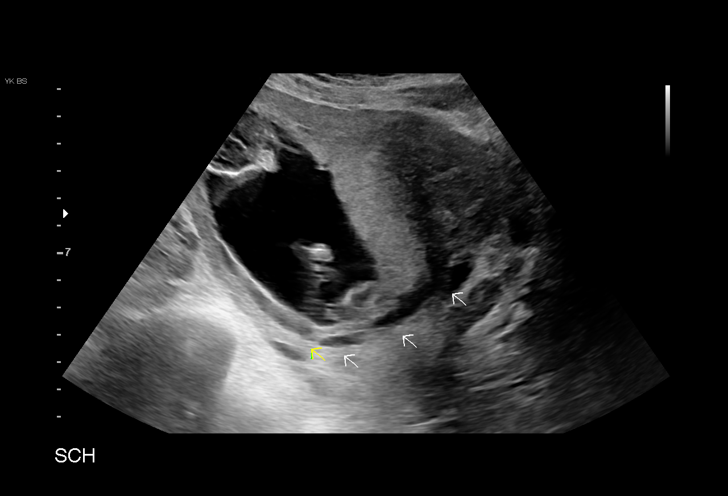
[im 15/28]
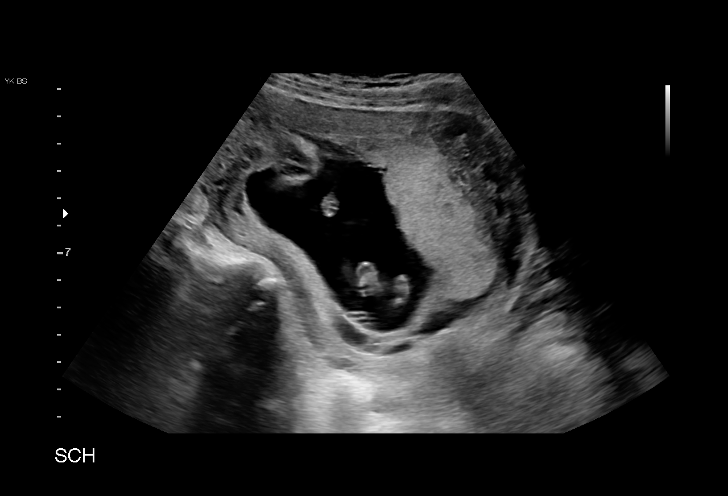
[im 16/28]
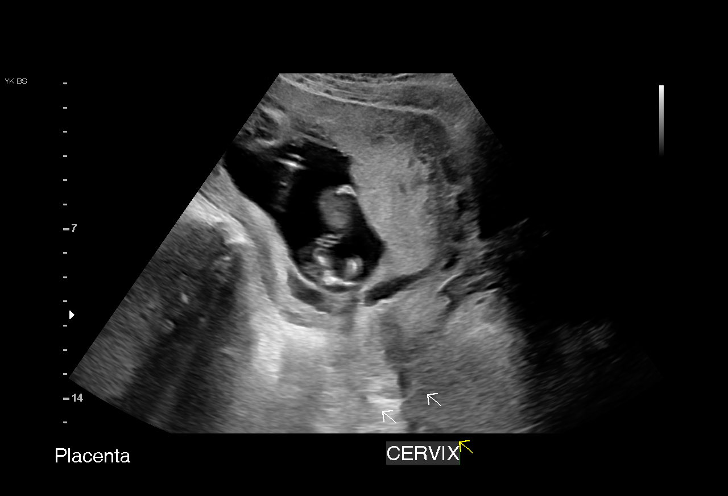
[im 18/28]
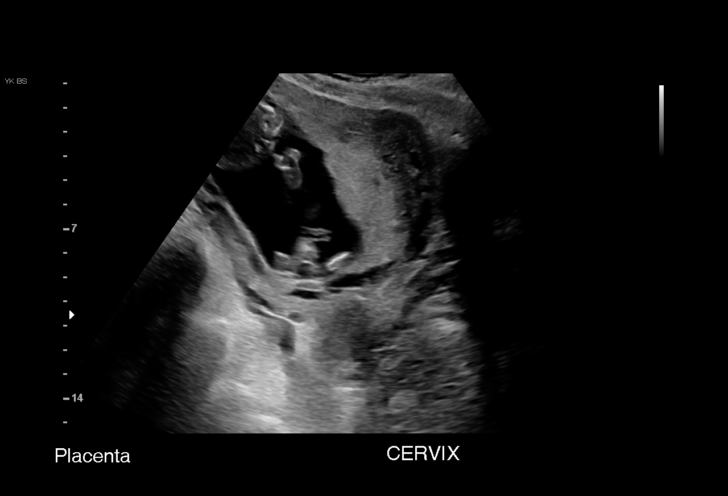
[im 20/28]
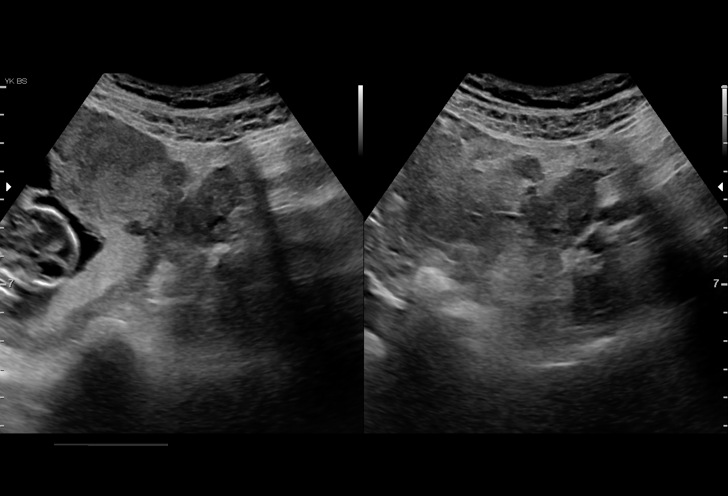
[im 22/28]
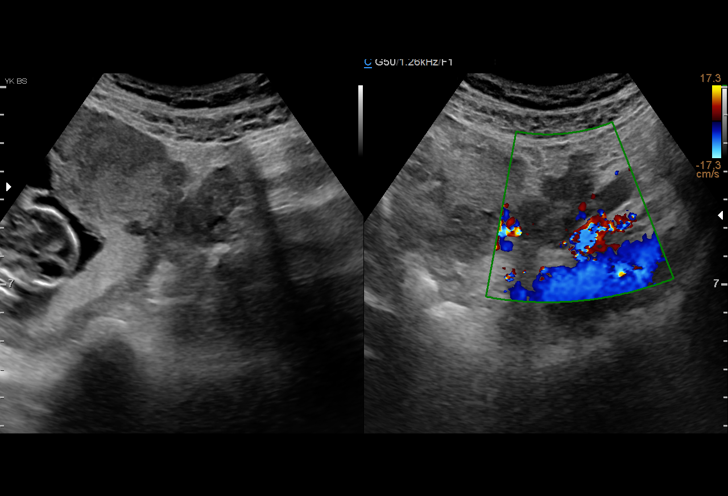
[im 24/28]
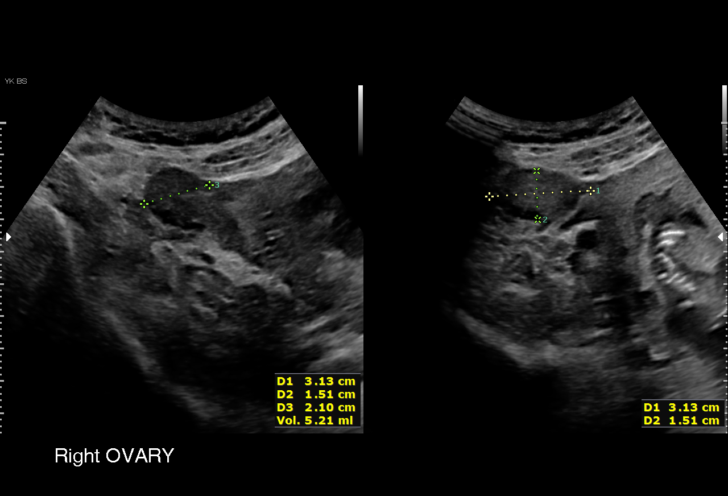
[im 26/28]
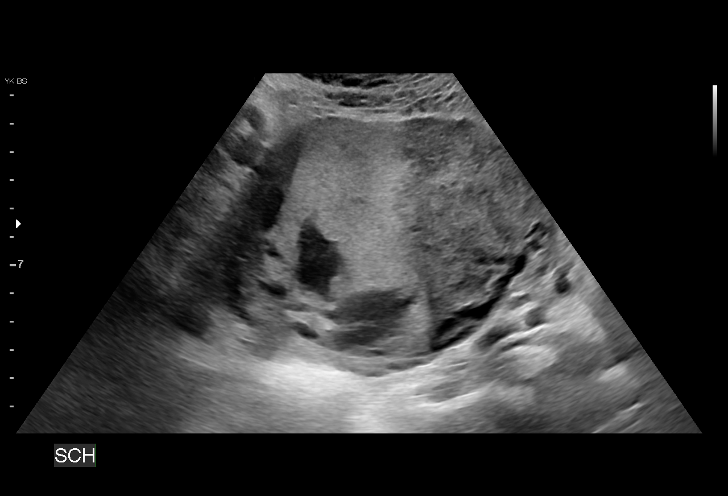
[im 28/28]
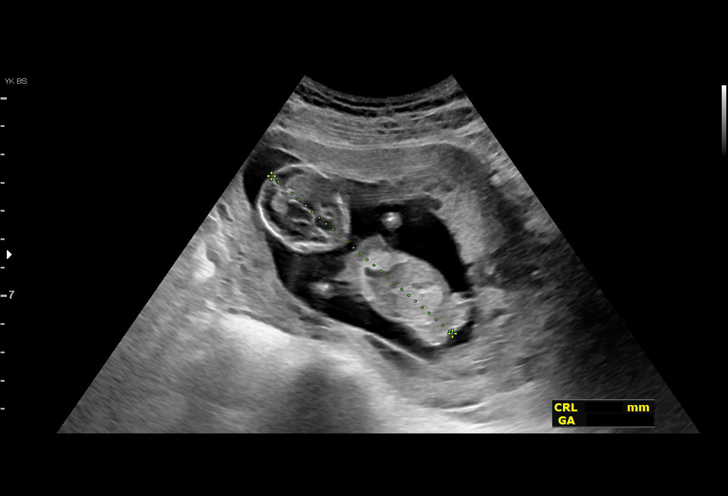

[15 of 28 positions shown; findings below may reference images not displayed]

FINDINGS: Intrauterine gestational sac: Single

Yolk sac:  Not Visualized.

Embryo:  Visualized.

Cardiac Activity: Visualized.

Heart Rate: 163 bpm

CRL:   84.7 mm   14 w 2 d                  US EDC: 03/17/2020

Subchorionic hemorrhage: There is a moderate volume of subchorionic
hemorrhage.

Maternal uterus/adnexae: There is a uterine fibroid measuring
approximately 5.7 cm. The ovaries are unremarkable.
IMPRESSION: 1. Single live IUP at 14 weeks and 2 days.
2. Moderate volume of subchorionic hemorrhage.
3. There is a 5.7 cm uterine fibroid.

## 2021-09-29 LAB — OB RESULTS CONSOLE ABO/RH

## 2021-09-29 LAB — OB RESULTS CONSOLE GC/CHLAMYDIA
Chlamydia: NEGATIVE
Neisseria Gonorrhea: NEGATIVE

## 2021-09-29 LAB — OB RESULTS CONSOLE HEPATITIS B SURFACE ANTIGEN
Hepatitis B Surface Ag: NEGATIVE
Hepatitis B Surface Ag: NEGATIVE

## 2021-09-29 LAB — HEPATITIS C ANTIBODY
HCV Ab: NEGATIVE
HCV Ab: NEGATIVE

## 2021-09-29 LAB — OB RESULTS CONSOLE RUBELLA ANTIBODY, IGM
Rubella: IMMUNE
Rubella: IMMUNE

## 2021-09-29 LAB — OB RESULTS CONSOLE ANTIBODY SCREEN: Antibody Screen: NEGATIVE

## 2021-09-29 LAB — OB RESULTS CONSOLE HIV ANTIBODY (ROUTINE TESTING)
HIV: NONREACTIVE
HIV: NONREACTIVE

## 2021-10-11 LAB — OB RESULTS CONSOLE GC/CHLAMYDIA
Chlamydia: NEGATIVE
Neisseria Gonorrhea: NEGATIVE

## 2022-04-04 LAB — OB RESULTS CONSOLE GBS
GBS: NEGATIVE
GBS: NEGATIVE

## 2022-04-20 ENCOUNTER — Encounter (HOSPITAL_COMMUNITY): Payer: Self-pay

## 2022-04-20 ENCOUNTER — Telehealth (HOSPITAL_COMMUNITY): Payer: Self-pay | Admitting: *Deleted

## 2022-04-20 ENCOUNTER — Encounter (HOSPITAL_COMMUNITY): Payer: Self-pay | Admitting: *Deleted

## 2022-04-20 NOTE — Patient Instructions (Signed)
Shakiela Minkoff Phillips-Mashburn  04/20/2022   Your procedure is scheduled on:  04/22/2022  Arrive at [place] at Entrance C on [address] at [facility]  and [address] [address]. You are invited to use the FREE valet parking or use the Visitor's parking deck.  Pick up the phone at the desk and dial [phone].  Call this number if you have problems the morning of surgery: [phone]  Remember:   Do not eat food:(After Midnight) Desps de medianoche.  Do not drink clear liquids: (After Midnight) Desps de medianoche.  Take these medicines the morning of surgery with A SIP OF WATER:  none   Do not wear jewelry, make-up or nail polish.  Do not wear lotions, powders, or perfumes. Do not wear deodorant.  Do not shave 48 hours prior to surgery.  Do not bring valuables to the hospital.  [facility] is not   responsible for any belongings or valuables brought to the hospital.  Contacts, dentures or bridgework may not be worn into surgery.  Leave suitcase in the car. After surgery it may be brought to your room.  For patients admitted to the hospital, checkout time is 11:00 AM the day of              discharge.      Please read over the following fact sheets that you were given:     Preparing for Surgery

## 2022-04-20 NOTE — Telephone Encounter (Signed)
Preadmission screen  

## 2022-04-20 NOTE — H&P (Signed)
Susan Grant is a 37 y.o. female presenting for primary cesarean section. Pregnancy complicated by size greater than dates. U/S @ 36 3/7 wks noted EFW 8# 4oz (>97%), AFI 96%, VTX. Glucola normal. Also IVF pregnancy, AMA - Panorama low risk, PP hemorrhage requiring transfusion after delivery #1. OB History     Gravida  2   Para  1   Term  1   Preterm      AB      Living  1      SAB      IAB      Ectopic      Multiple  0   Live Births  1          Past Medical History:  Diagnosis Date   Blood transfusion without reported diagnosis    Common migraine with intractable migraine 02/15/2016   Migraine    Postpartum hemorrhage    Past Surgical History:  Procedure Laterality Date   ANTERIOR CRUCIATE LIGAMENT REPAIR Bilateral 2001, 2002   MENISCUS REPAIR Right 2004   Family History: family history includes Alzheimer's disease in her paternal grandfather; Breast cancer in her paternal grandmother; Lung cancer in her maternal grandfather and maternal grandmother. Social History:  reports that she quit smoking about 10 years ago. Her smoking use included cigarettes. She has never used smokeless tobacco. She reports that she does not currently use alcohol. She reports that she does not use drugs.     Maternal Diabetes: No Genetic Screening: Normal Maternal Ultrasounds/Referrals: Other: Fetal Ultrasounds or other Referrals:  Other: macrosomia Maternal Substance Abuse:  No Significant Maternal Medications:  None Significant Maternal Lab Results:  Group B Strep negative Number of Prenatal Visits:greater than 3 verified prenatal visits Other Comments:  None  Review of Systems  Constitutional:  Negative for fever.   History   unknown if currently breastfeeding. Maternal Exam:  Abdomen: Fetal presentation: vertex   Physical Exam Cardiovascular:     Rate and Rhythm: Normal rate.  Pulmonary:     Effort: Pulmonary effort is normal.     Prenatal  labs: ABO, Rh: O/--/-- (07/26 0000) Antibody: Negative (07/26 0000) Rubella: Immune (07/26 0000) RPR:    HBsAg: Negative (07/26 0000)  HIV: Non-reactive (07/26 0000)  GBS: Negative/-- (01/29 0000)   Assessment/Plan: 37 yo G2P1 with EFW >97% After careful consideration she elects primary cesarean section due to increased risks of a traumatic vaginal delivery   Shon Millet II 04/20/2022, 4:10 PM

## 2022-04-21 ENCOUNTER — Encounter (HOSPITAL_COMMUNITY): Payer: Self-pay

## 2022-04-21 ENCOUNTER — Other Ambulatory Visit (HOSPITAL_COMMUNITY)
Admission: RE | Admit: 2022-04-21 | Discharge: 2022-04-21 | Disposition: A | Discharge status: Home / Self Care | Payer: BC Managed Care – PPO | Source: Ambulatory Visit | Attending: Obstetrics and Gynecology | Admitting: Obstetrics and Gynecology

## 2022-04-21 HISTORY — DX: Encounter for other specified aftercare: Z51.89

## 2022-04-21 LAB — CBC
HCT: 32.5 % — ABNORMAL LOW (ref 36.0–46.0)
Hemoglobin: 10.8 g/dL — ABNORMAL LOW (ref 12.0–15.0)
MCH: 29.1 pg (ref 26.0–34.0)
MCHC: 33.2 g/dL (ref 30.0–36.0)
MCV: 87.6 fL (ref 80.0–100.0)
Platelets: 132 10*3/uL — ABNORMAL LOW (ref 150–400)
RBC: 3.71 MIL/uL — ABNORMAL LOW (ref 3.87–5.11)
RDW: 12.5 % (ref 11.5–15.5)
WBC: 6 10*3/uL (ref 4.0–10.5)
nRBC: 0 % (ref 0.0–0.2)

## 2022-04-21 LAB — TYPE AND SCREEN
ABO/RH(D): O POS
Antibody Screen: NEGATIVE

## 2022-04-21 LAB — RPR: RPR Ser Ql: NONREACTIVE

## 2022-04-21 LAB — OB RESULTS CONSOLE RPR: RPR: NONREACTIVE

## 2022-04-22 ENCOUNTER — Encounter (HOSPITAL_COMMUNITY)
Admission: RE | Disposition: A | Discharge status: Home / Self Care | Payer: Self-pay | Source: Home / Self Care | Attending: Obstetrics and Gynecology

## 2022-04-22 ENCOUNTER — Encounter (HOSPITAL_COMMUNITY): Payer: Self-pay | Admitting: Obstetrics and Gynecology

## 2022-04-22 ENCOUNTER — Inpatient Hospital Stay (HOSPITAL_COMMUNITY): Payer: BC Managed Care – PPO | Admitting: Anesthesiology

## 2022-04-22 ENCOUNTER — Other Ambulatory Visit: Payer: Self-pay

## 2022-04-22 ENCOUNTER — Inpatient Hospital Stay (HOSPITAL_COMMUNITY)
Admission: RE | Admit: 2022-04-22 | Discharge: 2022-04-24 | DRG: 788 | Disposition: A | Discharge status: Home / Self Care | Payer: BC Managed Care – PPO | Attending: Obstetrics and Gynecology | Admitting: Obstetrics and Gynecology

## 2022-04-22 DIAGNOSIS — Z3A39 39 weeks gestation of pregnancy: Secondary | ICD-10-CM

## 2022-04-22 DIAGNOSIS — Z87891 Personal history of nicotine dependence: Secondary | ICD-10-CM | POA: Diagnosis not present

## 2022-04-22 DIAGNOSIS — O3663X0 Maternal care for excessive fetal growth, third trimester, not applicable or unspecified: Principal | ICD-10-CM | POA: Diagnosis present

## 2022-04-22 SURGERY — Surgical Case
Anesthesia: Spinal | Site: Abdomen

## 2022-04-22 MED ORDER — SOD CITRATE-CITRIC ACID 500-334 MG/5ML PO SOLN
30.0000 mL | ORAL | Status: AC
  Administered 2022-04-22: 30 mL via ORAL

## 2022-04-22 MED ORDER — NALOXONE HCL 0.4 MG/ML IJ SOLN: 0.4000 mg | INTRAMUSCULAR | Status: DC | PRN

## 2022-04-22 MED ORDER — SCOPOLAMINE 1 MG/3DAYS TD PT72
1.0000 | MEDICATED_PATCH | Freq: Once | TRANSDERMAL | Status: DC
  Administered 2022-04-22: 1.5 mg via TRANSDERMAL
  Filled 2022-04-22: qty 1

## 2022-04-22 MED ORDER — MORPHINE SULFATE (PF) 0.5 MG/ML IJ SOLN
INTRAMUSCULAR | Status: DC | PRN
  Administered 2022-04-22: 150 ug via INTRATHECAL

## 2022-04-22 MED ORDER — ACETAMINOPHEN 10 MG/ML IV SOLN
1000.0000 mg | Freq: Once | INTRAVENOUS | Status: AC
  Administered 2022-04-22: 1000 mg via INTRAVENOUS
  Filled 2022-04-22: qty 100

## 2022-04-22 MED ORDER — SIMETHICONE 80 MG PO CHEW: 80.0000 mg | CHEWABLE_TABLET | ORAL | Status: DC | PRN

## 2022-04-22 MED ORDER — SIMETHICONE 80 MG PO CHEW
80.0000 mg | CHEWABLE_TABLET | Freq: Three times a day (TID) | ORAL | Status: DC
  Administered 2022-04-23 – 2022-04-24 (×4): 80 mg via ORAL
  Filled 2022-04-22 (×4): qty 1

## 2022-04-22 MED ORDER — OXYTOCIN-SODIUM CHLORIDE 30-0.9 UT/500ML-% IV SOLN
INTRAVENOUS | Status: AC
  Filled 2022-04-22: qty 500

## 2022-04-22 MED ORDER — PHENYLEPHRINE HCL-NACL 20-0.9 MG/250ML-% IV SOLN
INTRAVENOUS | Status: AC
  Filled 2022-04-22: qty 250

## 2022-04-22 MED ORDER — SENNOSIDES-DOCUSATE SODIUM 8.6-50 MG PO TABS
2.0000 | ORAL_TABLET | Freq: Every day | ORAL | Status: DC
  Administered 2022-04-23 – 2022-04-24 (×2): 2 via ORAL
  Filled 2022-04-22 (×2): qty 2

## 2022-04-22 MED ORDER — NALOXONE HCL 4 MG/10ML IJ SOLN: 1.0000 ug/kg/h | INTRAVENOUS | Status: DC | PRN

## 2022-04-22 MED ORDER — HYDROMORPHONE HCL 1 MG/ML IJ SOLN: 0.2500 mg | INTRAMUSCULAR | Status: DC | PRN

## 2022-04-22 MED ORDER — BUPIVACAINE IN DEXTROSE 0.75-8.25 % IT SOLN
INTRATHECAL | Status: DC | PRN
  Administered 2022-04-22: 1.5 mL via INTRATHECAL

## 2022-04-22 MED ORDER — ONDANSETRON HCL 4 MG/2ML IJ SOLN: 4.0000 mg | Freq: Once | INTRAMUSCULAR | Status: DC | PRN

## 2022-04-22 MED ORDER — MEPERIDINE HCL 25 MG/ML IJ SOLN: 6.2500 mg | INTRAMUSCULAR | Status: DC | PRN

## 2022-04-22 MED ORDER — DIPHENHYDRAMINE HCL 25 MG PO CAPS: 25.0000 mg | ORAL_CAPSULE | Freq: Four times a day (QID) | ORAL | Status: DC | PRN

## 2022-04-22 MED ORDER — SODIUM CHLORIDE 0.9 % IV SOLN
12.5000 mg | Freq: Once | INTRAVENOUS | Status: AC
  Administered 2022-04-22: 12.5 mg via INTRAVENOUS
  Filled 2022-04-22: qty 12.5

## 2022-04-22 MED ORDER — KETOROLAC TROMETHAMINE 30 MG/ML IJ SOLN: 30.0000 mg | Freq: Four times a day (QID) | INTRAMUSCULAR | Status: AC | PRN

## 2022-04-22 MED ORDER — POVIDONE-IODINE 10 % EX SWAB
2.0000 | Freq: Once | CUTANEOUS | Status: AC
  Administered 2022-04-22: 2 via TOPICAL

## 2022-04-22 MED ORDER — LACTATED RINGERS IV SOLN: INTRAVENOUS | Status: DC

## 2022-04-22 MED ORDER — SODIUM CHLORIDE 0.9 % IR SOLN
Status: DC | PRN
  Administered 2022-04-22: 1000 mL

## 2022-04-22 MED ORDER — DEXAMETHASONE SODIUM PHOSPHATE 10 MG/ML IJ SOLN
INTRAMUSCULAR | Status: DC | PRN
  Administered 2022-04-22: 4 mg via INTRAVENOUS

## 2022-04-22 MED ORDER — KETOROLAC TROMETHAMINE 30 MG/ML IJ SOLN
30.0000 mg | Freq: Once | INTRAMUSCULAR | Status: AC | PRN
  Administered 2022-04-22: 30 mg via INTRAVENOUS

## 2022-04-22 MED ORDER — OXYTOCIN-SODIUM CHLORIDE 30-0.9 UT/500ML-% IV SOLN
INTRAVENOUS | Status: DC | PRN
  Administered 2022-04-22: 300 mL via INTRAVENOUS

## 2022-04-22 MED ORDER — COCONUT OIL OIL: 1.0000 | TOPICAL_OIL | Status: DC | PRN

## 2022-04-22 MED ORDER — WITCH HAZEL-GLYCERIN EX PADS: 1.0000 | MEDICATED_PAD | CUTANEOUS | Status: DC | PRN

## 2022-04-22 MED ORDER — KETOROLAC TROMETHAMINE 30 MG/ML IJ SOLN
INTRAMUSCULAR | Status: AC
  Filled 2022-04-22: qty 1

## 2022-04-22 MED ORDER — ONDANSETRON HCL 4 MG/2ML IJ SOLN: 4.0000 mg | Freq: Three times a day (TID) | INTRAMUSCULAR | Status: DC | PRN

## 2022-04-22 MED ORDER — CEFAZOLIN SODIUM-DEXTROSE 2-4 GM/100ML-% IV SOLN
2.0000 g | INTRAVENOUS | Status: AC
  Administered 2022-04-22: 2 g via INTRAVENOUS

## 2022-04-22 MED ORDER — ZOLPIDEM TARTRATE 5 MG PO TABS: 5.0000 mg | ORAL_TABLET | Freq: Every evening | ORAL | Status: DC | PRN

## 2022-04-22 MED ORDER — ACETAMINOPHEN 500 MG PO TABS
1000.0000 mg | ORAL_TABLET | Freq: Four times a day (QID) | ORAL | Status: DC
  Administered 2022-04-22 – 2022-04-24 (×7): 1000 mg via ORAL
  Filled 2022-04-22 (×8): qty 2

## 2022-04-22 MED ORDER — MENTHOL 3 MG MT LOZG: 1.0000 | LOZENGE | OROMUCOSAL | Status: DC | PRN

## 2022-04-22 MED ORDER — OXYTOCIN-SODIUM CHLORIDE 30-0.9 UT/500ML-% IV SOLN
2.5000 [IU]/h | INTRAVENOUS | Status: AC
  Administered 2022-04-22: 2.5 [IU]/h via INTRAVENOUS
  Filled 2022-04-22: qty 500

## 2022-04-22 MED ORDER — DEXAMETHASONE SODIUM PHOSPHATE 4 MG/ML IJ SOLN
INTRAMUSCULAR | Status: AC
  Filled 2022-04-22: qty 1

## 2022-04-22 MED ORDER — MORPHINE SULFATE (PF) 0.5 MG/ML IJ SOLN
INTRAMUSCULAR | Status: AC
  Filled 2022-04-22: qty 10

## 2022-04-22 MED ORDER — CEFAZOLIN SODIUM-DEXTROSE 2-4 GM/100ML-% IV SOLN
INTRAVENOUS | Status: AC
  Filled 2022-04-22: qty 100

## 2022-04-22 MED ORDER — DIPHENHYDRAMINE HCL 50 MG/ML IJ SOLN: 12.5000 mg | INTRAMUSCULAR | Status: DC | PRN

## 2022-04-22 MED ORDER — OXYCODONE HCL 5 MG/5ML PO SOLN: 5.0000 mg | Freq: Once | ORAL | Status: DC | PRN

## 2022-04-22 MED ORDER — SODIUM CHLORIDE 0.9% FLUSH: 3.0000 mL | INTRAVENOUS | Status: DC | PRN

## 2022-04-22 MED ORDER — OXYCODONE HCL 5 MG PO TABS: 5.0000 mg | ORAL_TABLET | Freq: Once | ORAL | Status: DC | PRN

## 2022-04-22 MED ORDER — AMISULPRIDE (ANTIEMETIC) 5 MG/2ML IV SOLN: 10.0000 mg | Freq: Once | INTRAVENOUS | Status: DC | PRN

## 2022-04-22 MED ORDER — FENTANYL CITRATE (PF) 100 MCG/2ML IJ SOLN
INTRAMUSCULAR | Status: DC | PRN
  Administered 2022-04-22: 15 ug via INTRATHECAL

## 2022-04-22 MED ORDER — PHENYLEPHRINE HCL-NACL 20-0.9 MG/250ML-% IV SOLN
INTRAVENOUS | Status: DC | PRN
  Administered 2022-04-22: 60 ug/min via INTRAVENOUS

## 2022-04-22 MED ORDER — DIBUCAINE (PERIANAL) 1 % EX OINT: 1.0000 | TOPICAL_OINTMENT | CUTANEOUS | Status: DC | PRN

## 2022-04-22 MED ORDER — ACETAMINOPHEN 500 MG PO TABS: 1000.0000 mg | ORAL_TABLET | Freq: Four times a day (QID) | ORAL | Status: DC

## 2022-04-22 MED ORDER — ONDANSETRON HCL 4 MG/2ML IJ SOLN
INTRAMUSCULAR | Status: DC | PRN
  Administered 2022-04-22: 4 mg via INTRAVENOUS

## 2022-04-22 MED ORDER — DIPHENHYDRAMINE HCL 25 MG PO CAPS: 25.0000 mg | ORAL_CAPSULE | ORAL | Status: DC | PRN

## 2022-04-22 MED ORDER — SOD CITRATE-CITRIC ACID 500-334 MG/5ML PO SOLN
ORAL | Status: AC
  Filled 2022-04-22: qty 30

## 2022-04-22 MED ORDER — PRENATAL MULTIVITAMIN CH
1.0000 | ORAL_TABLET | Freq: Every day | ORAL | Status: DC
  Administered 2022-04-23 – 2022-04-24 (×2): 1 via ORAL
  Filled 2022-04-22 (×2): qty 1

## 2022-04-22 MED ORDER — HYDROMORPHONE HCL 1 MG/ML IJ SOLN
0.2000 mg | INTRAMUSCULAR | Status: DC | PRN
  Administered 2022-04-22: 0.5 mg via INTRAVENOUS
  Filled 2022-04-22: qty 1

## 2022-04-22 MED ORDER — FENTANYL CITRATE (PF) 100 MCG/2ML IJ SOLN
INTRAMUSCULAR | Status: AC
  Filled 2022-04-22: qty 2

## 2022-04-22 MED ORDER — ONDANSETRON HCL 4 MG/2ML IJ SOLN
INTRAMUSCULAR | Status: AC
  Filled 2022-04-22: qty 2

## 2022-04-22 MED ORDER — STERILE WATER FOR IRRIGATION IR SOLN
Status: DC | PRN
  Administered 2022-04-22: 1000 mL

## 2022-04-22 MED ORDER — OXYCODONE HCL 5 MG PO TABS: 5.0000 mg | ORAL_TABLET | ORAL | Status: DC | PRN

## 2022-04-22 MED ORDER — IBUPROFEN 600 MG PO TABS
600.0000 mg | ORAL_TABLET | Freq: Four times a day (QID) | ORAL | Status: DC | PRN
  Administered 2022-04-22 – 2022-04-23 (×2): 600 mg via ORAL
  Filled 2022-04-22 (×2): qty 1

## 2022-04-22 SURGICAL SUPPLY — 35 items
BENZOIN TINCTURE PRP APPL 2/3 (GAUZE/BANDAGES/DRESSINGS) IMPLANT
CHLORAPREP W/TINT 26 (MISCELLANEOUS) ×2 IMPLANT
CLAMP UMBILICAL CORD (MISCELLANEOUS) ×1 IMPLANT
CLOTH BEACON ORANGE TIMEOUT ST (SAFETY) ×1 IMPLANT
DERMABOND ADVANCED .7 DNX12 (GAUZE/BANDAGES/DRESSINGS) IMPLANT
DRSG OPSITE POSTOP 4X10 (GAUZE/BANDAGES/DRESSINGS) ×1 IMPLANT
ELECT REM PT RETURN 9FT ADLT (ELECTROSURGICAL) ×1
ELECTRODE REM PT RTRN 9FT ADLT (ELECTROSURGICAL) ×1 IMPLANT
EXTRACTOR VACUUM M CUP 4 TUBE (SUCTIONS) IMPLANT
GAUZE SPONGE 4X4 12PLY STRL LF (GAUZE/BANDAGES/DRESSINGS) IMPLANT
GLOVE BIO SURGEON STRL SZ7.5 (GLOVE) ×1 IMPLANT
GLOVE BIOGEL PI IND STRL 7.0 (GLOVE) ×1 IMPLANT
GOWN STRL REUS W/TWL LRG LVL3 (GOWN DISPOSABLE) ×2 IMPLANT
KIT ABG SYR 3ML LUER SLIP (SYRINGE) ×1 IMPLANT
NDL HYPO 25X5/8 SAFETYGLIDE (NEEDLE) ×1 IMPLANT
NEEDLE HYPO 25X5/8 SAFETYGLIDE (NEEDLE) ×1 IMPLANT
NS IRRIG 1000ML POUR BTL (IV SOLUTION) ×1 IMPLANT
PACK C SECTION WH (CUSTOM PROCEDURE TRAY) ×1 IMPLANT
PAD ABD DERMACEA PRESS 5X9 (GAUZE/BANDAGES/DRESSINGS) IMPLANT
PAD OB MATERNITY 4.3X12.25 (PERSONAL CARE ITEMS) ×1 IMPLANT
STRIP CLOSURE SKIN 1/2X4 (GAUZE/BANDAGES/DRESSINGS) IMPLANT
SUT CHROMIC 0 CT 1 36 (SUTURE) IMPLANT
SUT CHROMIC 3 0 SH 27 (SUTURE) IMPLANT
SUT MNCRL 0 VIOLET CTX 36 (SUTURE) ×4 IMPLANT
SUT MONOCRYL 0 CTX 36 (SUTURE) ×4
SUT PDS AB 0 CTX 60 (SUTURE) ×1 IMPLANT
SUT PLAIN 0 NONE (SUTURE) IMPLANT
SUT PLAIN 2 0 (SUTURE) ×1
SUT PLAIN 2 0 XLH (SUTURE) IMPLANT
SUT PLAIN ABS 2-0 CT1 27XMFL (SUTURE) IMPLANT
SUT VIC AB 4-0 KS 27 (SUTURE) ×1 IMPLANT
TOWEL OR 17X24 6PK STRL BLUE (TOWEL DISPOSABLE) ×1 IMPLANT
TRAY FOLEY W/BAG SLVR 14FR LF (SET/KITS/TRAYS/PACK) ×1 IMPLANT
VACUUM CUP M-STYLE MYSTIC II (SUCTIONS) IMPLANT
WATER STERILE IRR 1000ML POUR (IV SOLUTION) ×1 IMPLANT

## 2022-04-22 NOTE — Anesthesia Preprocedure Evaluation (Addendum)
Anesthesia Evaluation  Patient identified by MRN, date of birth, ID band Patient awake    Reviewed: Allergy & Precautions, H&P , NPO status , Patient's Chart, lab work & pertinent test results  Airway Mallampati: II  TM Distance: >3 FB Neck ROM: Full    Dental no notable dental hx.    Pulmonary former smoker   Pulmonary exam normal breath sounds clear to auscultation       Cardiovascular Normal cardiovascular exam Rhythm:Regular Rate:Normal     Neuro/Psych  Headaches  negative psych ROS   GI/Hepatic negative GI ROS, Neg liver ROS,,,  Endo/Other  negative endocrine ROS    Renal/GU negative Renal ROS  negative genitourinary   Musculoskeletal negative musculoskeletal ROS (+)    Abdominal   Peds negative pediatric ROS (+)  Hematology  (+) Blood dyscrasia, anemia Hb 10.8, plt 132   Anesthesia Other Findings   Reproductive/Obstetrics (+) Pregnancy Fetal macrosomia  PP hemorrhage requiring transfusion after delivery #1, SVD w/ epidural 2022                             Anesthesia Physical Anesthesia Plan  ASA: 2  Anesthesia Plan: Spinal   Post-op Pain Management: Regional block*, Ofirmev IV (intra-op)* and Toradol IV (intra-op)*   Induction:   PONV Risk Score and Plan: 2 and Ondansetron, Dexamethasone and Treatment may vary due to age or medical condition  Airway Management Planned: Natural Airway  Additional Equipment: None  Intra-op Plan:   Post-operative Plan:   Informed Consent: I have reviewed the patients History and Physical, chart, labs and discussed the procedure including the risks, benefits and alternatives for the proposed anesthesia with the patient or authorized representative who has indicated his/her understanding and acceptance.       Plan Discussed with: CRNA  Anesthesia Plan Comments:        Anesthesia Quick Evaluation

## 2022-04-22 NOTE — Lactation Note (Signed)
This note was copied from a baby's chart. Lactation Consultation Note  Patient Name: Susan Grant [id number] Date: 04/22/2022   Age:37 hours  LC attempted to visit with the birth parent, but she was not feeling well. The birth parent will call for lactation assistance when she is ready to breastfeed.   Maternal Data    Feeding    LATCH Score                    Lactation Tools Discussed/Used    Interventions    Discharge    Consult Status      [name] 04/22/2022, 2:28 PM

## 2022-04-22 NOTE — Lactation Note (Signed)
This note was copied from a baby's chart. Lactation Consultation Note  Patient Name: Susan Grant [id number] Date: 04/22/2022 Reason for consult: Initial assessment;Term;Breastfeeding assistance Age:37 hours  LC entered the room and the birth parent was getting ready to attempt to breastfeed the infant.  Per the birth parent, she has been having trouble latching the infant to the breast due to him being sleepy at the breast.  LC assisted the birth parent in hand expressing 58m and spoon feeding it to the infant.  The infant latched in the football position with lips flanged, his tongue was down, and sucking was rhythmic.  The birth parent stated that the latch was comfortable.  The birth parent breast fed her previous child for 1 year (currently 234years old).  She stated that she did not start breastfeeding until her daughter was 267weeks old.  [place]mador Cityspoke with the birth parent about feeding cues, milk intake on the first 2 days of life, and all questions were answered.  LGautierreviewed the outpatient services brochure, left her name on the board, and exited the room.   Infant Feeding Plan:  Breastfeed 8+ times in 24 hours according to feeding cues.  Hand express and feed the infant the expressed milk via a spoon.  Call RDecorahfor assistance with breastfeeding.   Maternal Data Has patient been taught Hand Expression?: Yes Does the patient have breastfeeding experience prior to this delivery?: Yes How long did the patient breastfeed?: 1 year  Feeding Mother's Current Feeding Choice: Breast Milk  LATCH Score Latch: Grasps breast easily, tongue down, lips flanged, rhythmical sucking.  Audible Swallowing: Spontaneous and intermittent  Type of Nipple: Everted at rest and after stimulation  Comfort (Breast/Nipple): Soft / non-tender  Hold (Positioning): Assistance needed to correctly position infant at breast and maintain latch.  LATCH Score: 9   Lactation Tools  Discussed/Used    Interventions Interventions: Assisted with latch;Breast massage;Hand express;Adjust position;Support pillows;Education;LC Services brochure  Discharge Pump: DEBP;Personal  Consult Status Consult Status: Follow-up Date: 04/23/22 Follow-up type: In-patient    SLysbeth Penner2/16/2024, 3:19 PM

## 2022-04-22 NOTE — Op Note (Unsigned)
NAMETITANNA, AUGSPURGER MEDICAL RECORD NO: [id number] ACCOUNT NO: [id number] DATE OF BIRTH: [birth year] FACILITY: [facility] LOCATION: [facility] PHYSICIAN: Daleen Bo. Lyn Hollingshead, MD  Operative Report   DATE OF PROCEDURE: 04/22/2022  PREOPERATIVE DIAGNOSIS:  Fetal macrosomia.  POSTOPERATIVE DIAGNOSIS:  Fetal macrosomia.  PROCEDURE:  Primary low transverse cesarean section.  SURGEON:  Daleen Bo. Lyn Hollingshead, MD  ANESTHESIA:  Spinal.  ESTIMATED BLOOD LOSS:  Per anesthesiology note.  FINDINGS:  Viable female infant, Apgars pending, weight 9 pounds 12 ounces.  INDICATIONS AND CONSENT:  This patient is a 37 year old G2 P1 at 88 and 0/7 weeks.  An ultrasound was consistent with macrosomia.  Details are dictated in history and physical.  She elects cesarean section.  The procedure was discussed preoperatively  including risks of infection, organ damage, bleeding requiring transfusion of blood products with HIV and hepatitis acquisition, DVT, PE, pneumonia and wound breakdown.  She states she understands and agrees and consent is signed on the chart.  DESCRIPTION OF PROCEDURE:  The patient was taken to the operating room where she was identified.  Spinal anesthetic was placed per anesthesiology and she was placed in the dorsal supine position with a 15 degree left lateral wedge.  She receives IV  Ancef.  She was prepped vaginally with Betadine.  Foley catheter was placed and prepped abdominally with ChloraPrep.  Timeout was done.  After 3-minute drying time, she was draped in a sterile fashion.  After testing for adequate spinal anesthesia skin  was entered through a Pfannenstiel incision and dissection was carried out in layers to the peritoneum, which was taken down superiorly and inferiorly.  Vesicouterine peritoneum was dissected down as well.  The uterus was incised in a low transverse  manner and the uterine cavity was entered bluntly with a hemostat.  Clear fluid was noted.  The incision was  extended with the fingers.  Vertex was then delivered.  A small muscle splitting is done on either side on the rectus muscles and the vacuum  extractor was used to elevate the vertex through the incision.  The baby was then delivered without difficulty.  After 1 minute cord was clamped and cut and the baby was handed to waiting pediatrics team.  Placenta was delivered.  Uterine cavity is  clean.  Uterus was closed in 2 running locking layers of 0 Monocryl suture.  A single figure-of-eight 2-0 chromic was used in the center of the incision to obtain complete hemostasis.  Lavage was carried out.  Anterior peritoneum was closed in running  fashion with 0 Monocryl suture, which was also used to reapproximate the pyramidalis muscle in the midline.  The small muscle bodies were reapproximated on either side with 0 chromic and are then plicated in the midline as well.  The anterior rectus  fascia was closed in a running fashion with a 0 looped PDS.  Subcutaneous layer was closed with interrupted plain and the skin was closed in a subcuticular fashion with 4-0 Vicryl on a Keith needle.  Benzoin, Steri-Strips, honeycomb and pressure dressing  is applied.  All counts were correct.  The patient was taken to recovery room in stable condition.   PUS D: 04/22/2022 9:28:52 am T: 04/22/2022 10:08:00 am  JOB: [id number]

## 2022-04-22 NOTE — Anesthesia Postprocedure Evaluation (Signed)
Anesthesia Post Note  Patient: Susan Grant  Procedure(s) Performed: PRIMARY CESAREAN SECTION EDC: 04-29-22  ALLERG: NKDA (Abdomen)     Patient location during evaluation: PACU Anesthesia Type: Spinal Level of consciousness: awake and alert and oriented Pain management: pain level controlled Vital Signs Assessment: post-procedure vital signs reviewed and stable Respiratory status: spontaneous breathing, nonlabored ventilation and respiratory function stable Cardiovascular status: blood pressure returned to baseline and stable Postop Assessment: no headache, no backache, spinal receding and no apparent nausea or vomiting Anesthetic complications: no   No notable events documented.  Last Vitals:  Vitals:   04/22/22 1015 04/22/22 1030  BP: 93/70 107/75  Pulse: 87 68  Resp: 18 14  Temp:  36.7 C  SpO2: 99% 99%    Last Pain:  Vitals:   04/22/22 1030  TempSrc: Oral  PainSc: 0-No pain   Pain Goal:    LLE Motor Response: Purposeful movement (04/22/22 1030) LLE Sensation: Tingling (04/22/22 1030) RLE Motor Response: Purposeful movement (04/22/22 1030) RLE Sensation: Tingling (04/22/22 1030)     Epidural/Spinal Function Cutaneous sensation: Tingles (04/22/22 1030), Patient able to flex knees: Yes (04/22/22 1030), Patient able to lift hips off bed: No (04/22/22 1030), Back pain beyond tenderness at insertion site: No (04/22/22 1030), Progressively worsening motor and/or sensory loss: No (04/22/22 1030), Bowel and/or bladder incontinence post epidural: No (04/22/22 1030)  Jarome Matin [place]

## 2022-04-22 NOTE — Progress Notes (Signed)
No changes to H&P per patient history Reviewed procedure-cesarean section and risks including infection, organ damage, bleeding/transfusion-HIV/Hep, DVT/PE, pneumonia, wound breakdown.  All questions answered. She states she understands and agrees. Consent signed.

## 2022-04-22 NOTE — Brief Op Note (Signed)
04/22/2022  9:22 AM  PATIENT:  Susan Grant  37 y.o. female  PRE-OPERATIVE DIAGNOSIS:  Cesarean section for fetal macrosomnia  POST-OPERATIVE DIAGNOSIS:  Cesarean section for fetal macrosomnia  PROCEDURE:  Procedure(s): PRIMARY CESAREAN SECTION EDC: 04-29-22  ALLERG: NKDA (N/A)  SURGEON:  Surgeon(s) and Role:    * Everlene Farrier, MD - Primary  PHYSICIAN ASSISTANT:   ASSISTANTS:    ANESTHESIA:   spinal  EBL:  per anesthesiology note   BLOOD ADMINISTERED:none  DRAINS: Urinary Catheter (Foley)   LOCAL MEDICATIONS USED:  NONE  SPECIMEN:  No Specimen  DISPOSITION OF SPECIMEN:  N/A  COUNTS:  YES  TOURNIQUET:  * No tourniquets in log *  DICTATION: .Other Dictation: Dictation Number [id number]  PLAN OF CARE: Admit to inpatient   PATIENT DISPOSITION:  PACU - hemodynamically stable.   Delay start of Pharmacological VTE agent (>24hrs) due to surgical blood loss or risk of bleeding: not applicable

## 2022-04-22 NOTE — Anesthesia Procedure Notes (Signed)
Spinal  Patient location during procedure: OR Start time: 04/22/2022 8:08 AM End time: 04/22/2022 8:13 AM Reason for block: surgical anesthesia Staffing Performed: anesthesiologist  Anesthesiologist: Pervis Hocking, DO Performed by: Pervis Hocking, DO Authorized by: Pervis Hocking, DO   Preanesthetic Checklist Completed: patient identified, IV checked, risks and benefits discussed, surgical consent, monitors and equipment checked, pre-op evaluation and timeout performed Spinal Block Patient position: sitting Prep: DuraPrep and site prepped and draped Patient monitoring: cardiac monitor, continuous pulse ox and blood pressure Approach: midline Location: L3-4 Injection technique: single-shot Needle Needle type: Pencan  Needle gauge: 24 G Needle length: 9 cm Assessment Sensory level: T6 Events: CSF return Additional Notes Functioning IV was confirmed and monitors were applied. Sterile prep and drape, including hand hygiene and sterile gloves were used. The patient was positioned and the spine was prepped. The skin was anesthetized with lidocaine.  Free flow of clear CSF was obtained prior to injecting local anesthetic into the CSF.  The spinal needle aspirated freely following injection.  The needle was carefully withdrawn.  The patient tolerated the procedure well.

## 2022-04-22 NOTE — Transfer of Care (Signed)
Immediate Anesthesia Transfer of Care Note  Patient: Susan Grant  Procedure(s) Performed: PRIMARY CESAREAN SECTION EDC: 04-29-22  ALLERG: NKDA (Abdomen)  Patient Location: PACU  Anesthesia Type:Spinal  Level of Consciousness: awake  Airway & Oxygen Therapy: Patient Spontanous Breathing  Post-op Assessment: Report given to RN and Post -op Vital signs reviewed and stable  Post vital signs: Reviewed and stable  Last Vitals:  Vitals Value Taken Time  BP 108/70 04/22/22 0938  Temp    Pulse 78 04/22/22 0942  Resp 13 04/22/22 0942  SpO2 98 % 04/22/22 0942  Vitals shown include unvalidated device data.  Last Pain:  Vitals:   04/22/22 0610  TempSrc: Oral         Complications: No notable events documented.

## 2022-04-23 LAB — CBC
HCT: 26.7 % — ABNORMAL LOW (ref 36.0–46.0)
Hemoglobin: 8.9 g/dL — ABNORMAL LOW (ref 12.0–15.0)
MCH: 28.9 pg (ref 26.0–34.0)
MCHC: 33.3 g/dL (ref 30.0–36.0)
MCV: 86.7 fL (ref 80.0–100.0)
Platelets: 137 10*3/uL — ABNORMAL LOW (ref 150–400)
RBC: 3.08 MIL/uL — ABNORMAL LOW (ref 3.87–5.11)
RDW: 12.8 % (ref 11.5–15.5)
WBC: 10.1 10*3/uL (ref 4.0–10.5)
nRBC: 0 % (ref 0.0–0.2)

## 2022-04-23 MED ORDER — IBUPROFEN 600 MG PO TABS
600.0000 mg | ORAL_TABLET | Freq: Four times a day (QID) | ORAL | Status: DC
  Administered 2022-04-23 – 2022-04-24 (×5): 600 mg via ORAL
  Filled 2022-04-23 (×5): qty 1

## 2022-04-23 NOTE — Lactation Note (Signed)
This note was copied from a baby's chart. Lactation Consultation Note  Patient Name: Susan Grant [id number] Date: 04/23/2022  Reason for consult: Follow-up assessment;Difficult latch;Term  Age:37 hours P2, 39.0 GA  Infant has been sleepy after his circumcision this morning  and is now fussy as he is awakening. Birth parent has experience with breastfeeding for 1 year with her last child. She states baby's latch is causing nipple soreness. Baby has full rounded cheeks and a slightly recessed chin. He is also a little sleepy.   Assisted birth parent/mother in football hold with pillows placed for good alignment to breast. Colostrum was easily expressed prior to latch. Baby is eager to latch initially, tends to latch shallow with lower lip tucked causing nipple to be pinched. Infant re-latched until the latch was more comfortable. Infant was nutritive with audible swallows for 5-6 minutes but needs alternate breast massage or shoulder stimulation to remain engaged in the feeding. Birth parent/mother noticed when baby became sleepy and slipped to a shallow latch causing nipple discomfort. Instructed not to let baby stay on breast when pain is noted. Infant was taken off breast by birth mother and nipple was pinched  with positional striped appearance, no skin breakdown.  Coconut oil and comfort gels given with instructions and told to use alternately. Shells given to avoid friction. Birth parent instructed to call for assistance with breastfeeding as needed.   Feeding Mother's Current Feeding Choice: Breast Milk  LATCH Score Latch: Repeated attempts needed to sustain latch, nipple held in mouth throughout feeding, stimulation needed to elicit sucking reflex.  Audible Swallowing: A few with stimulation  Type of Nipple: Everted at rest and after stimulation  Comfort (Breast/Nipple): Filling, red/small blisters or bruises, mild/mod discomfort  Hold (Positioning): Assistance needed  to correctly position infant at breast and maintain latch.  LATCH Score: 6   Lactation Tools Discussed/Used Tools: Shells;Coconut oil;Comfort gels  Interventions Interventions: Breast feeding basics reviewed;Assisted with latch;Skin to skin;Breast massage;Hand express;Breast compression;Adjust position;Support pillows;Coconut oil;Shells;Comfort gels;Education  Discharge Pump: DEBP;Personal  Consult Status Date: 04/24/22 Follow-up type: In-patient    [name] M 04/23/2022, 4:06 PM

## 2022-04-23 NOTE — Progress Notes (Signed)
Subjective: Postpartum Day 1: Cesarean Delivery Patient reports tolerating PO, + flatus, and no problems voiding.    Objective: Vital signs in last 24 hours: Temp:  [97.6 F (36.4 C)-98.9 F (37.2 C)] 98 F (36.7 C) (02/17 0618) Pulse Rate:  [58-87] 72 (02/17 0618) Resp:  [14-20] 18 (02/17 0618) BP: (93-114)/(60-75) 101/60 (02/17 0618) SpO2:  [98 %-100 %] 99 % (02/17 0618)  Physical Exam:  General: alert, cooperative, and no distress Lochia: appropriate Uterine Fundus: firm Incision: healing well DVT Evaluation: No evidence of DVT seen on physical exam.  Recent Labs    04/21/22 0857 04/23/22 0605  HGB 10.8* 8.9*  HCT 32.5* 26.7*    Assessment/Plan: Status post Cesarean section. Doing well postoperatively.  Continue current care. D/W circumcision of newborn boy baby including risks. She states she understands and agrees. Shon Millet II, MD 04/23/2022, 9:22 AM

## 2022-04-24 MED ORDER — IBUPROFEN 600 MG PO TABS: 600.0000 mg | ORAL_TABLET | Freq: Four times a day (QID) | ORAL | 0 refills | Status: AC | PRN

## 2022-04-24 MED ORDER — ACETAMINOPHEN 500 MG PO TABS: 1000.0000 mg | ORAL_TABLET | Freq: Four times a day (QID) | ORAL | 0 refills | Status: AC | PRN

## 2022-04-24 NOTE — Discharge Summary (Signed)
Postpartum Discharge Summary  /Date of Service updated 04/24/22 /    Patient Name: Susan Grant DOB: [birth year] MRN: [id number]  Date of admission: 04/22/2022 Delivery date:04/22/2022  Delivering provider: Everlene Farrier  Date of discharge: 04/24/2022  Admitting diagnosis: Macrosomia [P08.0] Intrauterine pregnancy: [redacted]w[redacted]d    Secondary diagnosis:  Principal Problem:   Macrosomia  Additional problems:     Discharge diagnosis: Term Pregnancy Delivered                                              Post partum procedures: Augmentation: N/A Complications: None  Hospital course: Sceduled C/S   37y.o. yo G2P2002 at [id number] admitted to the hospital 04/22/2022 for scheduled cesarean section with the following indication:Macrosomia.Delivery details are as follows:  Membrane Rupture Time/Date: 8:38 AM ,04/22/2022   Delivery Method:C-Section, Vacuum Assisted  Details of operation can be found in separate operative note.  Patient had a postpartum course complicated by.  She is ambulating, tolerating a regular diet, passing flatus, and urinating well. Patient is discharged home in stable condition on  04/24/22        Newborn Data: Birth date:04/22/2022  Birth time:8:41 AM  Gender:Female  Living status:Living  Apgars:9 ,9  Weight:4410 g     Magnesium Sulfate received: No BMZ received: No Rhophylac:No MMR:No T-DaP:Given prenatally Flu: N/A Transfusion:No  Physical exam  Vitals:   04/23/22 0618 04/23/22 1441 04/23/22 2015 04/24/22 0549  BP: 101/60 126/69 113/64 111/65  Pulse: 72 60 67 77  Resp: 18 17 18 18  $ Temp: 98 F (36.7 C) 98.4 F (36.9 C) 97.6 F (36.4 C) 97.9 F (36.6 C)  TempSrc: Oral Oral Oral Oral  SpO2: 99% 100%    Weight:      Height:       General: alert, cooperative, and no distress Lochia: appropriate Uterine Fundus: firm Incision: Healing well with no significant drainage DVT Evaluation: No evidence of DVT seen on physical exam. Labs: Lab  Results  Component Value Date   WBC 10.1 04/23/2022   HGB 8.9 (L) 04/23/2022   HCT 26.7 (L) 04/23/2022   MCV 86.7 04/23/2022   PLT 137 (L) 04/23/2022      Latest Ref Rng & Units 03/17/2020    3:02 PM  CMP  Glucose 70 - 99 mg/dL 92   BUN 6 - 20 mg/dL 7   Creatinine 0.44 - 1.00 mg/dL 0.58   Sodium 135 - 145 mmol/L 134   Potassium 3.5 - 5.1 mmol/L 3.1   Chloride 98 - 111 mmol/L 103   CO2 22 - 32 mmol/L 18   Calcium 8.9 - 10.3 mg/dL 9.1   Total Protein 6.5 - 8.1 g/dL 6.0   Total Bilirubin 0.3 - 1.2 mg/dL 0.5   Alkaline Phos 38 - 126 U/L 74   AST 15 - 41 U/L 22   ALT 0 - 44 U/L 20    Edinburgh Score:    04/24/2022    3:14 AM  Edinburgh Postnatal Depression Scale Screening Tool  I have been able to laugh and see the funny side of things. 0  I have looked forward with enjoyment to things. 0  I have blamed myself unnecessarily when things went wrong. 0  I have been anxious or worried for no good reason. 0  I have felt scared or panicky for no good  reason. 0  Things have been getting on top of me. 0  I have been so unhappy that I have had difficulty sleeping. 0  I have felt sad or miserable. 0  I have been so unhappy that I have been crying. 0  The thought of harming myself has occurred to me. 0  Edinburgh Postnatal Depression Scale Total 0      After visit meds:  Allergies as of 04/24/2022   No Known Allergies      Medication List     TAKE these medications    acetaminophen 500 MG tablet Commonly known as: TYLENOL Take 2 tablets (1,000 mg total) by mouth every 6 (six) hours as needed.   ibuprofen 600 MG tablet Commonly known as: ADVIL Take 1 tablet (600 mg total) by mouth every 6 (six) hours as needed.   multivitamin-prenatal 27-0.8 MG Tabs tablet Take 1 tablet by mouth daily at 12 noon.         Discharge home in stable condition Infant Feeding: Breast Infant Disposition:home with mother Discharge instruction: per After Visit Summary and Postpartum  booklet. Activity: Advance as tolerated. Pelvic rest for 6 weeks.  Diet: routine diet Anticipated Birth Control: Unsure Postpartum Appointment:6 weeks Additional Postpartum F/U:  Future Appointments:No future appointments. Follow up Visit:      04/24/2022 Allena Katz, MD

## 2022-04-24 NOTE — Lactation Note (Signed)
This note was copied from a baby's chart. Lactation Consultation Note  Patient Name: Susan Grant [id number] Date: 04/24/2022  Reason for consult: Follow-up assessment;Term;Nipple pain/trauma  Age:37 hours P2, 39.0 GA  Birth parent/Mother states baby is breastfeeding more frequently and eagerly, however, he is still latching shallow on the breast causing nipple discomfort.   She reports experiencing sore nipples with her first child. She plans to see the lactation consultant at her pediatrician's office for OP follow up.   Birth Parent has a DEBP at home and encouraged to pump if soreness prevents her from latching and give her expressed milk.   She has been applying coconut oil to nipples. She has comfort gels and shells. Nipples are "pink and tender" per mother.  Feeding Mother's Current Feeding Choice: Breast Milk  Lactation Tools Discussed/Used Tools: Shells;Coconut oil;Comfort gels  Discharge Birth Mother plans to schedule an an appointment with Lactation Consultant at [facility].  Consult Status Consult Status: Complete Date: 04/24/22    Susan Grant 04/24/2022, 8:32 AM

## 2022-04-27 ENCOUNTER — Inpatient Hospital Stay (HOSPITAL_COMMUNITY): Payer: BC Managed Care – PPO

## 2022-05-03 ENCOUNTER — Telehealth (HOSPITAL_COMMUNITY): Payer: Self-pay | Admitting: *Deleted

## 2022-05-03 NOTE — Telephone Encounter (Signed)
Left phone voicemail message.  Odis Hollingshead, RN 05-03-2022 at 4:32pm

## 2022-06-01 ENCOUNTER — Telehealth (HOSPITAL_COMMUNITY): Payer: Self-pay

## 2022-06-01 NOTE — Telephone Encounter (Signed)
Preadmission labs

## 2022-11-14 ENCOUNTER — Ambulatory Visit: Payer: BC Managed Care – PPO | Admitting: Physician Assistant
